# Patient Record
Sex: Male | Born: 1955
Health system: Southern US, Community
[De-identification: ages and names within clinical notes are randomized; demographics above are authoritative.]

## PROBLEM LIST (undated history)

## (undated) DIAGNOSIS — F419 Anxiety disorder, unspecified: Secondary | ICD-10-CM

## (undated) DIAGNOSIS — H409 Unspecified glaucoma: Secondary | ICD-10-CM

## (undated) HISTORY — DX: Anxiety disorder, unspecified: F41.9

## (undated) HISTORY — PX: SPINE SURGERY: SHX786

## (undated) HISTORY — DX: Unspecified glaucoma: H40.9

---

## 2002-06-20 ENCOUNTER — Encounter: Admission: RE | Admit: 2002-06-20 | Discharge: 2002-06-20 | Payer: Self-pay | Admitting: Family Medicine

## 2002-06-20 ENCOUNTER — Encounter: Payer: Self-pay | Admitting: Family Medicine

## 2002-07-05 ENCOUNTER — Ambulatory Visit (HOSPITAL_COMMUNITY): Admission: RE | Admit: 2002-07-05 | Discharge: 2002-07-05 | Payer: Self-pay | Admitting: Neurosurgery

## 2002-07-05 ENCOUNTER — Encounter: Payer: Self-pay | Admitting: Neurosurgery

## 2008-11-13 LAB — HM COLONOSCOPY

## 2008-12-28 ENCOUNTER — Ambulatory Visit: Payer: Self-pay | Admitting: Internal Medicine

## 2009-01-11 ENCOUNTER — Ambulatory Visit: Payer: Self-pay | Admitting: Internal Medicine

## 2011-01-31 ENCOUNTER — Encounter: Payer: Self-pay | Admitting: Physician Assistant

## 2011-02-14 NOTE — Op Note (Signed)
   NAME:  Jay Larsen, Jay Larsen                      ACCOUNT NO.:  [id number]   MEDICAL RECORD NO.:  [id number]                   PATIENT TYPE:  OIB   LOCATION:  NA                                   FACILITY:  [facility]   PHYSICIAN:  Tanya Nones. Jeral Fruit, M.D.             DATE OF BIRTH:  [birth year]   DATE OF PROCEDURE:  07/05/2002  DATE OF DISCHARGE:                                 OPERATIVE REPORT   PREOPERATIVE DIAGNOSIS:  Left L5-S1 herniated disk with a free fragment.   POSTOPERATIVE DIAGNOSIS:  Left L5-S1 herniated disk with a free fragment.   PROCEDURE:  Left L5-S1 diskectomy with removal of four to five large free  fragments, total diskectomy, foraminotomy.  Microscope.   SURGEON:  Tanya Nones. Jeral Fruit, M.D.   ASSISTANT:  Stefani Dama, M.D.   CLINICAL HISTORY:  The patient is a 55 year old gentleman.  I met him  because and left leg pain for more than five weeks.  He has 2/5 weakness on  dorsiflexion.  MRI showed a large herniated disk with a fragment going to  the foramen.  Surgery was advised.   DESCRIPTION OF PROCEDURE:  The patient was taken to the OR, and he was  positioned in a prone manner.  The back was prepped with Betadine.  A  midline incision from L5 to S1 was made, and muscle was retracted laterally.  X-ray showed that indeed we were at the level of L5-S1.  There with the help  of the microscope we started drilling in the lower lamina of L5, the upper  of S1.  The yellow ligament was also excised.  Indeed, we found that the S1  nerve root was swollen and reddish.  Retraction was done and right at the  takeoff of S1, there were at least four to five fragments, two of them at  the level of the axilla.  Removal was done.  Then we entered the disk space,  where there was an opening in the annulus.  Total gross diskectomy medial  and lateral was achieved.  Then we investigated the foramen of L5 as well as  S1, it was negative.  Valsalva maneuver was negative.  Then the area  was  irrigated.  Fentanyl and Depo-Medrol were left in the dural space, and the  wound was closed with Vicryl and Steri-Strip.                                               Tanya Nones. Jeral Fruit, M.D.    EMB/MEDQ  D:  07/05/2002  T:  07/06/2002  Job:  [id number]

## 2011-02-14 NOTE — H&P (Signed)
NAME:  Jay Larsen, Jay Larsen                      ACCOUNT NO.:  [id number]   MEDICAL RECORD NO.:  [id number]                   PATIENT TYPE:  OIB   LOCATION:  3030                                 FACILITY:  [facility]   PHYSICIAN:  Hilda Lias, MD                  DATE OF BIRTH:  [birth year]   DATE OF ADMISSION:  07/05/2002  DATE OF DISCHARGE:  07/05/2002                                HISTORY & PHYSICAL   HISTORY OF PRESENT ILLNESS:  The patient is a gentleman who about four weeks  ago developed the sudden onset of back pain.  It went posterolaterally to  the hip and then to the left foot associated with weakness.  The patient had  been unable to walk.  He was quite miserable.  The patient had conservative  treatment without any improvement.  He denies any problem with the right  leg.  An MRI was obtained and sent for further evaluation.   PAST MEDICAL HISTORY:  Negative.   SOCIAL HISTORY:  The patient does not smoke.  He drinks socially.   FAMILY HISTORY:  His mother is 27 with diabetes and high blood pressure.  His father is 15 with high blood pressure.   REVIEW OF SYMPTOMS:  Positive for back and left leg pain.   PHYSICAL EXAMINATION:  HEIGHT:  6 feet 4 inches.  WEIGHT:  255 pounds.  GENERAL APPEARANCE:  The patient came to my office limping from the left  leg.  He had difficulty sitting.  HEENT:  Normal.  NECK:  Normal.  LUNGS:  Clear.  HEART:  Heart sounds normal.  ABDOMEN:  Normal.  EXTREMITIES:  Normal pulses.  NEUROLOGIC:  Mental status normal.  Cranial nerves normal.  Strength is 5/5,  except for 2/5 weakness in plantar flexion and 4/5 weakness in dorsiflexion.  Reflexes symmetrical with absent left ankle jerk.  Straight leg raising on  the left side is positive at 30 degrees.   LABORATORY DATA:  The MRI shows that he has some degenerative disk at the  level of L4-5 and L5-S1, but at the level of L5-S1 he has a large herniated  disk with a flattening compromising the  S1 nerve root.   CLINICAL IMPRESSION:  1. Left L5-S1 herniated disk with weakness of the left foot.  2. Degenerative disk disease, lumbar.    RECOMMENDATIONS:  The patient wants to proceed with surgery.  The surgery  was fully explained to him.  The risks, such as infection, CSF leak,  worsening pain, need for further surgery, no improvement whatsoever, and  damage to the __________ were explained.  The patient declined another  opinion.                                               Lynne Logan  Jeral Fruit, MD    EB/MEDQ  D:  07/05/2002  T:  07/07/2002  Job:  [id number]

## 2013-03-28 ENCOUNTER — Ambulatory Visit (INDEPENDENT_AMBULATORY_CARE_PROVIDER_SITE_OTHER): Payer: BC Managed Care – PPO | Admitting: Nurse Practitioner

## 2013-03-28 ENCOUNTER — Encounter: Payer: Self-pay | Admitting: Nurse Practitioner

## 2013-03-28 VITALS — BP 137/89 | HR 91 | Temp 97.5°F | Ht 76.0 in | Wt 190.0 lb

## 2013-03-28 DIAGNOSIS — Z0000 Encounter for general adult medical examination without abnormal findings: Secondary | ICD-10-CM

## 2013-03-28 DIAGNOSIS — F411 Generalized anxiety disorder: Secondary | ICD-10-CM

## 2013-03-28 DIAGNOSIS — H409 Unspecified glaucoma: Secondary | ICD-10-CM

## 2013-03-28 LAB — POCT URINALYSIS DIPSTICK
Bilirubin, UA: NEGATIVE
Glucose, UA: NEGATIVE
Ketones, UA: NEGATIVE
Leukocytes, UA: NEGATIVE
Nitrite, UA: NEGATIVE
Protein, UA: NEGATIVE
Spec Grav, UA: 1.015
Urobilinogen, UA: NEGATIVE
pH, UA: 6.5

## 2013-03-28 LAB — POCT UA - MICROSCOPIC ONLY
Bacteria, U Microscopic: NEGATIVE
Casts, Ur, LPF, POC: NEGATIVE
Mucus, UA: NEGATIVE
WBC, Ur, HPF, POC: NEGATIVE
Yeast, UA: NEGATIVE

## 2013-03-28 MED ORDER — LORAZEPAM 0.5 MG PO TABS: 0.5000 mg | ORAL_TABLET | Freq: Two times a day (BID) | ORAL | Status: DC | PRN

## 2013-03-28 NOTE — Progress Notes (Signed)
  Subjective:    Patient ID: Jay Larsen, male    DOB: Dec 10, 1955, 57 y.o.   MRN: [id number]  HPI Patient here today fr CPE- He is doing quite well an dhas no complaints today- Only current medical problem is glaucoma left eye that he uses eye drops for.    Review of Systems  Constitutional: Negative.   HENT: Negative.   Eyes: Negative.   Respiratory: Negative.   Cardiovascular: Negative.   Gastrointestinal: Negative.   Endocrine: Negative.   Genitourinary: Negative.   Musculoskeletal: Negative.   Allergic/Immunologic: Negative.   Neurological: Negative.   Hematological: Negative.   Psychiatric/Behavioral: Negative.        Objective:   Physical Exam  Constitutional: He is oriented to person, place, and time. He appears well-developed and well-nourished.  HENT:  Head: Normocephalic.  Right Ear: External ear normal.  Left Ear: External ear normal.  Nose: Nose normal.  Mouth/Throat: Oropharynx is clear and moist.  Eyes: EOM are normal. Pupils are equal, round, and reactive to light.  Neck: Normal range of motion. Neck supple. No thyromegaly present.  Cardiovascular: Normal rate, regular rhythm, normal heart sounds and intact distal pulses.   No murmur heard. Pulmonary/Chest: Effort normal and breath sounds normal. He has no wheezes. He has no rales.  Abdominal: Soft. Bowel sounds are normal.  Genitourinary: Prostate normal and penis normal. Guaiac negative stool.  Musculoskeletal: Normal range of motion.  Neurological: He is alert and oriented to person, place, and time.  Skin: Skin is warm and dry.  Psychiatric: He has a normal mood and affect. His behavior is normal. Judgment and thought content normal.    BP 137/89  Pulse 91  Temp(Src) 97.5 F (36.4 C) (Oral)  Ht 6\' 4"  (1.93 m)  Wt 190 lb (86.183 kg)  BMI 23.14 kg/m2       Assessment & Plan:   1. Annual physical exam   2. GAD (generalized anxiety disorder)   3. Glaucoma    Orders Placed This  Encounter  Procedures  . POCT urinalysis dipstick  . POCT UA - Microscopic Only   Outpatient Encounter Prescriptions as of 03/28/2013  Medication Sig Dispense Refill  . brimonidine (ALPHAGAN P) 0.1 % SOLN Place 2 drops into the left eye daily.      Marland Kitchen LORazepam (ATIVAN) 0.5 MG tablet Take 1 tablet (0.5 mg total) by mouth every 12 (twelve) hours as needed for anxiety.  30 tablet  0  . Travoprost, BAK Free, (TRAVATAN) 0.004 % SOLN ophthalmic solution Place 1 drop into the left eye at bedtime.      . [DISCONTINUED] LORazepam (ATIVAN) 0.5 MG tablet Take 0.5 mg by mouth every 12 (twelve) hours as needed for anxiety.       No facility-administered encounter medications on file as of 03/28/2013.   Continue current meds Diet and exercise encouraged Labs done at work and reviewed at appointment Follow up in 6 months to recheck LDL  Mary-Margaret Daphine Deutscher, FNP

## 2013-03-28 NOTE — Patient Instructions (Signed)
Health Maintenance, Males A healthy lifestyle and preventative care can promote health and wellness.  Maintain regular health, dental, and eye exams.  Eat a healthy diet. Foods like vegetables, fruits, whole grains, low-fat dairy products, and lean protein foods contain the nutrients you need without too many calories. Decrease your intake of foods high in solid fats, added sugars, and salt. Get information about a proper diet from your caregiver, if necessary.  Regular physical exercise is one of the most important things you can do for your health. Most adults should get at least 150 minutes of moderate-intensity exercise (any activity that increases your heart rate and causes you to sweat) each week. In addition, most adults need muscle-strengthening exercises on 2 or more days a week.   Maintain a healthy weight. The body mass index (BMI) is a screening tool to identify possible weight problems. It provides an estimate of body fat based on height and weight. Your caregiver can help determine your BMI, and can help you achieve or maintain a healthy weight. For adults 20 years and older:  A BMI below 18.5 is considered underweight.  A BMI of 18.5 to 24.9 is normal.  A BMI of 25 to 29.9 is considered overweight.  A BMI of 30 and above is considered obese.  Maintain normal blood lipids and cholesterol by exercising and minimizing your intake of saturated fat. Eat a balanced diet with plenty of fruits and vegetables. Blood tests for lipids and cholesterol should begin at age 20 and be repeated every 5 years. If your lipid or cholesterol levels are high, you are over 50, or you are a high risk for heart disease, you may need your cholesterol levels checked more frequently.Ongoing high lipid and cholesterol levels should be treated with medicines, if diet and exercise are not effective.  If you smoke, find out from your caregiver how to quit. If you do not use tobacco, do not start.  If you  choose to drink alcohol, do not exceed 2 drinks per day. One drink is considered to be 12 ounces (355 mL) of beer, 5 ounces (148 mL) of wine, or 1.5 ounces (44 mL) of liquor.  Avoid use of street drugs. Do not share needles with anyone. Ask for help if you need support or instructions about stopping the use of drugs.  High blood pressure causes heart disease and increases the risk of stroke. Blood pressure should be checked at least every 1 to 2 years. Ongoing high blood pressure should be treated with medicines if weight loss and exercise are not effective.  If you are 45 to 55 years old, ask your caregiver if you should take aspirin to prevent heart disease.  Diabetes screening involves taking a blood sample to check your fasting blood sugar level. This should be done once every 3 years, after age 45, if you are within normal weight and without risk factors for diabetes. Testing should be considered at a younger age or be carried out more frequently if you are overweight and have at least 1 risk factor for diabetes.  Colorectal cancer can be detected and often prevented. Most routine colorectal cancer screening begins at the age of 50 and continues through age 75. However, your caregiver may recommend screening at an earlier age if you have risk factors for colon cancer. On a yearly basis, your caregiver may provide home test kits to check for hidden blood in the stool. Use of a small camera at the end of a tube,   to directly examine the colon (sigmoidoscopy or colonoscopy), can detect the earliest forms of colorectal cancer. Talk to your caregiver about this at age 50, when routine screening begins. Direct examination of the colon should be repeated every 5 to 10 years through age 75, unless early forms of pre-cancerous polyps or small growths are found.  Hepatitis C blood testing is recommended for all people born from 1945 through 1965 and any individual with known risks for hepatitis C.  Healthy  men should no longer receive prostate-specific antigen (PSA) blood tests as part of routine cancer screening. Consult with your caregiver about prostate cancer screening.  Testicular cancer screening is not recommended for adolescents or adult males who have no symptoms. Screening includes self-exam, caregiver exam, and other screening tests. Consult with your caregiver about any symptoms you have or any concerns you have about testicular cancer.  Practice safe sex. Use condoms and avoid high-risk sexual practices to reduce the spread of sexually transmitted infections (STIs).  Use sunscreen with a sun protection factor (SPF) of 30 or greater. Apply sunscreen liberally and repeatedly throughout the day. You should seek shade when your shadow is shorter than you. Protect yourself by wearing long sleeves, pants, a wide-brimmed hat, and sunglasses year round, whenever you are outdoors.  Notify your caregiver of new moles or changes in moles, especially if there is a change in shape or color. Also notify your caregiver if a mole is larger than the size of a pencil eraser.  A one-time screening for abdominal aortic aneurysm (AAA) and surgical repair of large AAAs by sound wave imaging (ultrasonography) is recommended for ages 65 to 75 years who are current or former smokers.  Stay current with your immunizations. Document Released: 03/13/2008 Document Revised: 12/08/2011 Document Reviewed: 02/10/2011 [employer] Patient Information 2014 [employer].  

## 2014-03-29 ENCOUNTER — Encounter: Payer: Self-pay | Admitting: Nurse Practitioner

## 2014-03-29 ENCOUNTER — Ambulatory Visit (INDEPENDENT_AMBULATORY_CARE_PROVIDER_SITE_OTHER): Payer: BC Managed Care – PPO

## 2014-03-29 ENCOUNTER — Ambulatory Visit (INDEPENDENT_AMBULATORY_CARE_PROVIDER_SITE_OTHER): Payer: BC Managed Care – PPO | Admitting: Nurse Practitioner

## 2014-03-29 VITALS — BP 126/74 | HR 80 | Temp 98.1°F | Ht 76.0 in | Wt 183.0 lb

## 2014-03-29 DIAGNOSIS — Z0000 Encounter for general adult medical examination without abnormal findings: Secondary | ICD-10-CM

## 2014-03-29 DIAGNOSIS — F411 Generalized anxiety disorder: Secondary | ICD-10-CM

## 2014-03-29 MED ORDER — LORAZEPAM 0.5 MG PO TABS: 0.5000 mg | ORAL_TABLET | Freq: Two times a day (BID) | ORAL | Status: DC | PRN

## 2014-03-29 NOTE — Addendum Note (Signed)
Addended by: Bennie PieriniMARTIN, MARY-MARGARET on: 03/29/2014 09:43 AM   Modules accepted: Orders

## 2014-03-29 NOTE — Patient Instructions (Signed)

## 2014-03-29 NOTE — Progress Notes (Addendum)
   Subjective:    Patient ID: Jay Larsen, male    DOB: 11/08/1955, 58 y.o.   MRN: [id number]  HPI Patient here today for annual physical exam. Doing well without complaints. Brought labs that were done at work to be reviewed. His only medical problem is GAD and he is taking ativan on prn basis.     Review of Systems  Constitutional: Negative.   HENT: Negative.   Respiratory: Negative.   Cardiovascular: Negative.   Genitourinary: Negative.   Neurological: Negative.   Psychiatric/Behavioral: Negative.   All other systems reviewed and are negative.      Objective:   Physical Exam  Constitutional: He is oriented to person, place, and time. He appears well-developed and well-nourished.  HENT:  Head: Normocephalic.  Right Ear: External ear normal.  Left Ear: External ear normal.  Nose: Nose normal.  Mouth/Throat: Oropharynx is clear and moist.  Eyes: Conjunctivae are normal. Pupils are equal, round, and reactive to light.  Left eye drifts laterally  Neck: Normal range of motion. Neck supple. No JVD present. No thyromegaly present.  Cardiovascular: Normal rate, regular rhythm, normal heart sounds and intact distal pulses.  Exam reveals no gallop and no friction rub.   No murmur heard. Pulmonary/Chest: Effort normal and breath sounds normal. No respiratory distress. He has no wheezes. He has no rales. He exhibits no tenderness.  Abdominal: Soft. Bowel sounds are normal. He exhibits no mass. There is no tenderness.  Genitourinary: Prostate normal and penis normal. Guaiac negative stool.  Musculoskeletal: Normal range of motion. He exhibits no edema.  Lymphadenopathy:    He has no cervical adenopathy.  Neurological: He is alert and oriented to person, place, and time. No cranial nerve deficit.  Skin: Skin is warm and dry.  Psychiatric: He has a normal mood and affect. His behavior is normal. Judgment and thought content normal.    BP 126/74  Pulse 80  Temp(Src) 98.1 F  (36.7 C) (Oral)  Ht 6\' 4"  (1.93 m)  Wt 183 lb (83.008 kg)  BMI 22.28 kg/m2  SpO2 98% EKG- NSR-Mary-Margaret Daphine DeutscherMartin, FNP Chest xray - normal- no acute findings- Preliminary reading by Paulene FloorMary Brookelin Felber, FNP  [facility]       Assessment & Plan:   1. GAD (generalized anxiety disorder)   2. Annual physical exam      Meds ordered this encounter  Medications  . LORazepam (ATIVAN) 0.5 MG tablet    Sig: Take 1 tablet (0.5 mg total) by mouth every 12 (twelve) hours as needed for anxiety.    Dispense:  30 tablet    Refill:  1    Order Specific Question:  Supervising Provider    Answer:  Ernestina PennaMOORE, DONALD W [[id number]]    Labs reviewed at appointment with patient Health maintenance reviewed Diet and exercise encouraged Continue all meds Follow up  In 6 months and prn    Mary-Margaret Daphine DeutscherMartin, FNP

## 2015-01-02 ENCOUNTER — Ambulatory Visit (HOSPITAL_COMMUNITY)
Admission: RE | Admit: 2015-01-02 | Discharge: 2015-01-02 | Disposition: A | Discharge status: Home / Self Care | Payer: BLUE CROSS/BLUE SHIELD | Source: Ambulatory Visit | Attending: Family Medicine | Admitting: Family Medicine

## 2015-01-02 ENCOUNTER — Ambulatory Visit (INDEPENDENT_AMBULATORY_CARE_PROVIDER_SITE_OTHER): Payer: BLUE CROSS/BLUE SHIELD | Admitting: Family Medicine

## 2015-01-02 ENCOUNTER — Encounter: Payer: Self-pay | Admitting: Family Medicine

## 2015-01-02 VITALS — BP 144/79 | HR 105 | Temp 97.7°F | Ht 76.0 in | Wt 183.0 lb

## 2015-01-02 DIAGNOSIS — R110 Nausea: Secondary | ICD-10-CM | POA: Insufficient documentation

## 2015-01-02 DIAGNOSIS — R1011 Right upper quadrant pain: Secondary | ICD-10-CM

## 2015-01-02 MED ORDER — ONDANSETRON HCL 4 MG PO TABS: 4.0000 mg | ORAL_TABLET | Freq: Three times a day (TID) | ORAL | Status: DC | PRN

## 2015-01-02 NOTE — Progress Notes (Signed)
   Subjective:    Patient ID: Jay Larsen, male    DOB: 10/09/1955, 59 y.o.   MRN: [id number]  HPI 59 year old gentleman with two-week history of nausea. He vomited this morning. He has had some right upper quadrant pain. Food seems to aggravate symptoms. There are no food intolerances prior to the onset of symptoms. He has not had any prior abdominal surgery. He's had minimal diarrhea. There is been no known exposure to community gastroenteritis.  Patient Active Problem List   Diagnosis Date Noted  . GAD (generalized anxiety disorder) 03/28/2013  . Glaucoma 03/28/2013   Outpatient Encounter Prescriptions as of 01/02/2015  Medication Sig  . brimonidine (ALPHAGAN P) 0.1 % SOLN Place 2 drops into the left eye daily.  Marland Kitchen. LORazepam (ATIVAN) 0.5 MG tablet Take 1 tablet (0.5 mg total) by mouth every 12 (twelve) hours as needed for anxiety.  . Travoprost, BAK Free, (TRAVATAN) 0.004 % SOLN ophthalmic solution Place 1 drop into the left eye at bedtime.       Review of Systems  Constitutional: Negative.   HENT: Negative.   Eyes: Negative.   Respiratory: Negative.  Negative for shortness of breath.   Cardiovascular: Negative.  Negative for chest pain and leg swelling.  Gastrointestinal: Positive for nausea, vomiting and abdominal pain.  Genitourinary: Negative.   Musculoskeletal: Negative.   Skin: Negative.   Neurological: Negative.   Psychiatric/Behavioral: Negative.   All other systems reviewed and are negative.      Objective:   Physical Exam  Constitutional: He appears well-developed and well-nourished.  Cardiovascular: Normal rate and regular rhythm.   Pulmonary/Chest: Effort normal and breath sounds normal.  Abdominal: Soft. Bowel sounds are normal. There is no tenderness. There is no rebound and no guarding.    BP 144/79 mmHg  Pulse 105  Temp(Src) 97.7 F (36.5 C) (Oral)  Ht 6\' 4"  (1.93 m)  Wt 183 lb (83.008 kg)  BMI 22.28 kg/m2       Assessment & Plan:  1. RUQ  pain Consider gallbladder disease versus enteritis plan treat symptomatically with Zofran. Keep diet on the light side are clear liquids and schedule ultrasound - US Abdomen Complete; Future  Frederica KusterStephen M Miller MD

## 2015-01-04 ENCOUNTER — Telehealth: Payer: Self-pay | Admitting: *Deleted

## 2015-01-04 NOTE — Telephone Encounter (Signed)
-----   Message from Frederica KusterStephen M Miller, MD sent at 01/03/2015 12:47 PM EDT ----- Ultrasound results show no evidence of gallbladder disease so what I would expect his symptoms to be consistent with a viral enteritis

## 2015-01-04 NOTE — Telephone Encounter (Signed)
lmtcb regarding ultrasound report 

## 2015-02-04 HISTORY — PX: EYE SURGERY: SHX253

## 2015-04-06 ENCOUNTER — Encounter: Payer: Self-pay | Admitting: Internal Medicine

## 2015-05-10 ENCOUNTER — Ambulatory Visit (INDEPENDENT_AMBULATORY_CARE_PROVIDER_SITE_OTHER): Payer: BLUE CROSS/BLUE SHIELD | Admitting: Family Medicine

## 2015-05-10 ENCOUNTER — Encounter (INDEPENDENT_AMBULATORY_CARE_PROVIDER_SITE_OTHER): Payer: Self-pay

## 2015-05-10 ENCOUNTER — Encounter: Payer: Self-pay | Admitting: Family Medicine

## 2015-05-10 VITALS — BP 137/75 | HR 83 | Temp 97.4°F | Ht 76.0 in | Wt 183.6 lb

## 2015-05-10 DIAGNOSIS — Z0000 Encounter for general adult medical examination without abnormal findings: Secondary | ICD-10-CM | POA: Diagnosis not present

## 2015-05-10 DIAGNOSIS — F411 Generalized anxiety disorder: Secondary | ICD-10-CM

## 2015-05-10 MED ORDER — LORAZEPAM 0.5 MG PO TABS: 0.5000 mg | ORAL_TABLET | Freq: Two times a day (BID) | ORAL | Status: DC | PRN

## 2015-05-10 NOTE — Assessment & Plan Note (Signed)
In general very good health, discussed exercise and diet recommendations Congratulated him on good habits that he's developed, reviewed labs that are drawn in his company Fasting blood sugar 97, LDL 120, HDL 44, PSA 0.2

## 2015-05-10 NOTE — Assessment & Plan Note (Signed)
No persistent symptoms, uses approximately 6-10 Xanax per year Refilled Xanax, one refill Discussed daily medications with him, however I don't think this is necessary with him only using 1-2 pills every 2-3 months

## 2015-05-10 NOTE — Progress Notes (Signed)
Patient ID: Jay Larsen, male   DOB: [birth year], 59 y.o.   MRN: [id number]   HPI  Patient presents today for annual physical exam  Patient explains that he is in good health and has no symptoms to complain about today.  He has an anxiety disorder and uses about 6-10 Xanax per year. He needs a refill today. He states last use was about 2 months ago when his wife lost her job and they were in a very stressful time.  He denies chest pain, dyspnea, palpitations, leg edema, abdominal pain, bowel or bladder dysfunction, and problems with appetite  He walks about 3 miles 3-5 times per week In general he watches his diet and eats mostly healthy foods with small amount of junk food.  PMH: Smoking status noted Past medical, surgical, family, and social history were reviewed and updated in relevant portions of EMR ROS: Per HPI, otherwise negative  Objective: BP 137/75 mmHg  Pulse 83  Temp(Src) 97.4 F (36.3 C) (Oral)  Ht  (1.93 m)  Wt 183 lb 9.6 oz (83.28 kg)  BMI 22.36 kg/m2 Gen: NAD, alert, cooperative with exam HEENT: NCAT, nares clear bilaterally, oropharynx clear, left TM with some heme crusted cerumen, right TM WNL, strabismus Neck: Supple, no tender lymphadenopathy CV: RRR, good S1/S2, no murmur Resp: CTABL, no wheezes, non-labored Abd: SNTND, BS present, no guarding or organomegaly Ext: No edema, warm Neuro: Alert and oriented, No gross deficits, 2+ patellar reflexes Skin: No rash  Assessment and plan:  GAD (generalized anxiety disorder) No persistent symptoms, uses approximately 6-10 Xanax per year Refilled Xanax, one refill Discussed daily medications with him, however I don't think this is necessary with him only using 1-2 pills every 2-3 months  Annual physical exam In general very good health, discussed exercise and diet recommendations Congratulated him on good habits that he's developed, reviewed labs that are drawn in his company Fasting blood sugar 97,  LDL 120, HDL 44, PSA 0.2     Meds ordered this encounter  Medications  . LORazepam (ATIVAN) 0.5 MG tablet    Sig: Take 1 tablet (0.5 mg total) by mouth every 12 (twelve) hours as needed for anxiety.    Dispense:  30 tablet    Refill:  1    Murtis Sink, MD [place] [facility] 05/10/2015, 10:11 AM

## 2015-05-10 NOTE — Patient Instructions (Signed)
Great to meet you!  You are doing great work watching your diet and exercises, Keep it up!  Come back as needed, otherwise we will see you in a year.   Please send me your labs when you get them, I would be glad to review them with you if you'd like.

## 2016-01-22 DIAGNOSIS — H4032X3 Glaucoma secondary to eye trauma, left eye, severe stage: Secondary | ICD-10-CM | POA: Diagnosis not present

## 2016-01-23 DIAGNOSIS — R7301 Impaired fasting glucose: Secondary | ICD-10-CM | POA: Diagnosis not present

## 2016-01-23 DIAGNOSIS — F419 Anxiety disorder, unspecified: Secondary | ICD-10-CM | POA: Diagnosis not present

## 2016-01-23 DIAGNOSIS — Z008 Encounter for other general examination: Secondary | ICD-10-CM | POA: Diagnosis not present

## 2016-01-23 DIAGNOSIS — E782 Mixed hyperlipidemia: Secondary | ICD-10-CM | POA: Diagnosis not present

## 2016-02-04 DIAGNOSIS — H43312 Vitreous membranes and strands, left eye: Secondary | ICD-10-CM | POA: Diagnosis not present

## 2016-02-04 DIAGNOSIS — H4303 Vitreous prolapse, bilateral: Secondary | ICD-10-CM | POA: Diagnosis not present

## 2016-02-04 DIAGNOSIS — H43392 Other vitreous opacities, left eye: Secondary | ICD-10-CM | POA: Diagnosis not present

## 2016-02-04 DIAGNOSIS — H4032X3 Glaucoma secondary to eye trauma, left eye, severe stage: Secondary | ICD-10-CM | POA: Diagnosis not present

## 2016-03-01 DIAGNOSIS — W57XXXA Bitten or stung by nonvenomous insect and other nonvenomous arthropods, initial encounter: Secondary | ICD-10-CM | POA: Diagnosis not present

## 2016-03-01 DIAGNOSIS — R21 Rash and other nonspecific skin eruption: Secondary | ICD-10-CM | POA: Diagnosis not present

## 2016-05-13 IMAGING — US US ABDOMEN COMPLETE
1 series · 14 of 25 positions shown · non-contrast
Comparison: None.

CLINICAL DATA: Right upper quadrant abdominal pain with nausea for
2 weeks.

EXAM:
ULTRASOUND ABDOMEN COMPLETE

[Series 1: us abdomen complete · 0.15mm/px · 14 of 78 slices shown]
[im 1/78]
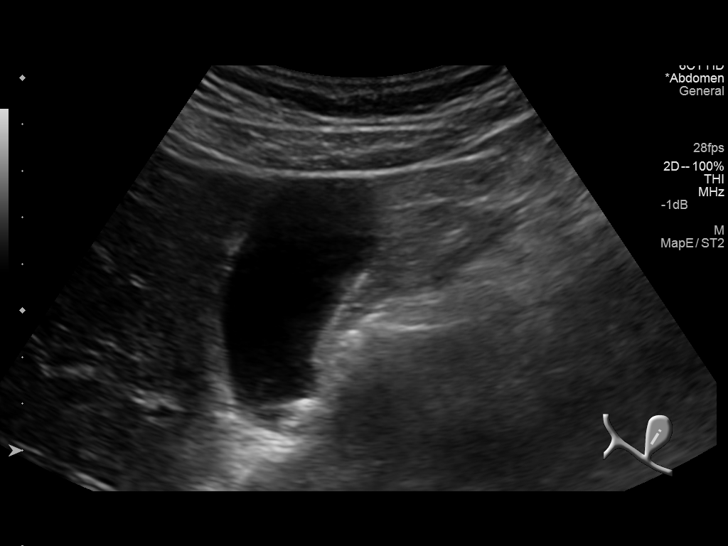
[im 7/78]
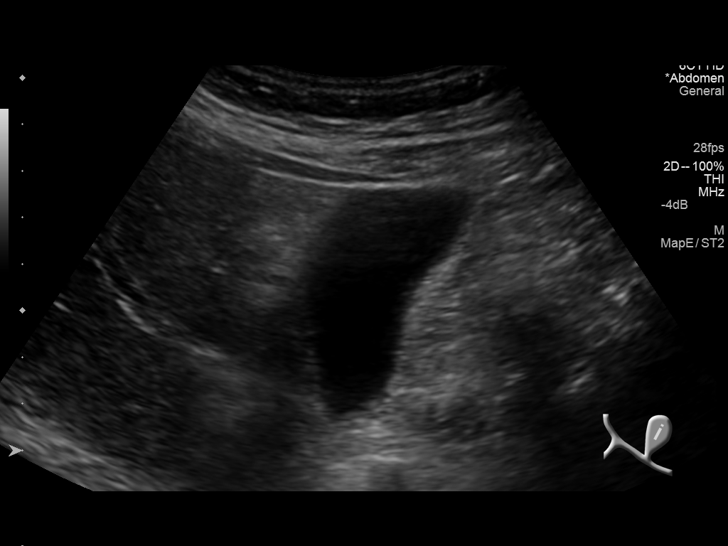
[im 13/78]
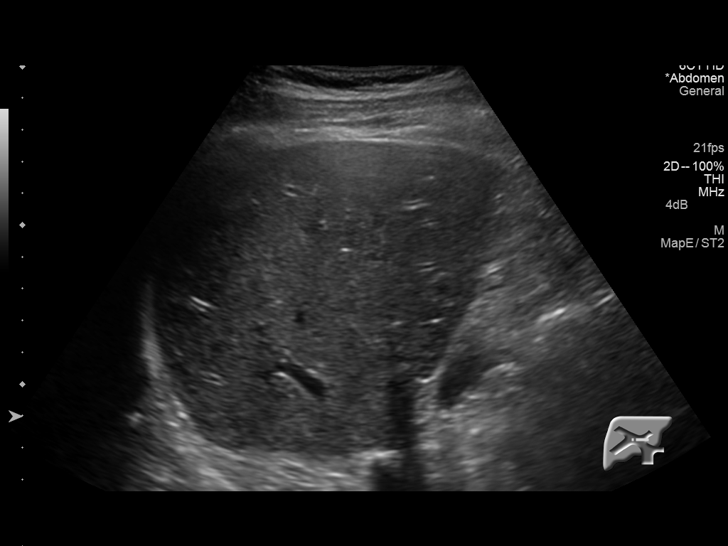
[im 20/78]
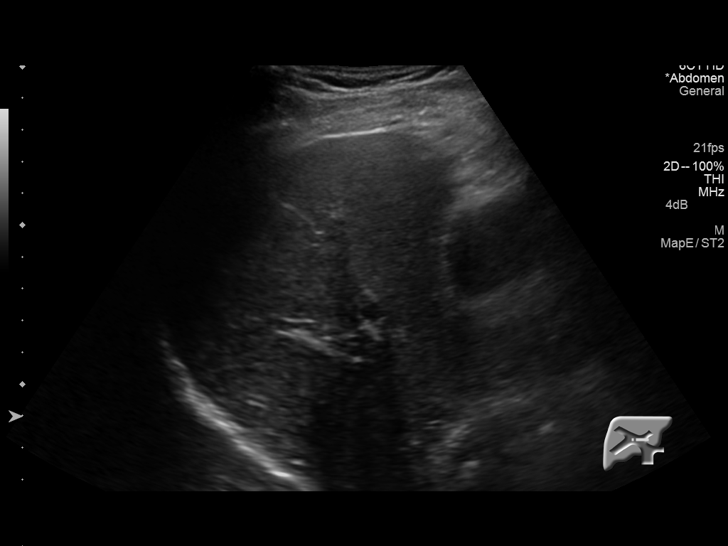
[im 26/78]
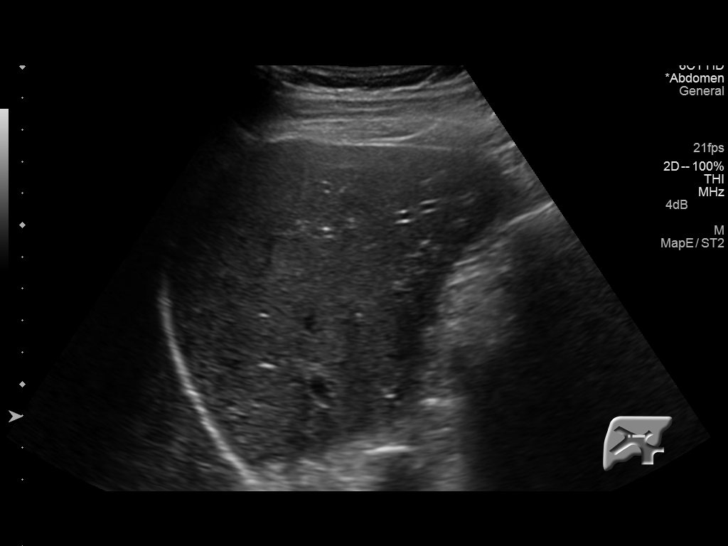
[im 29/78]
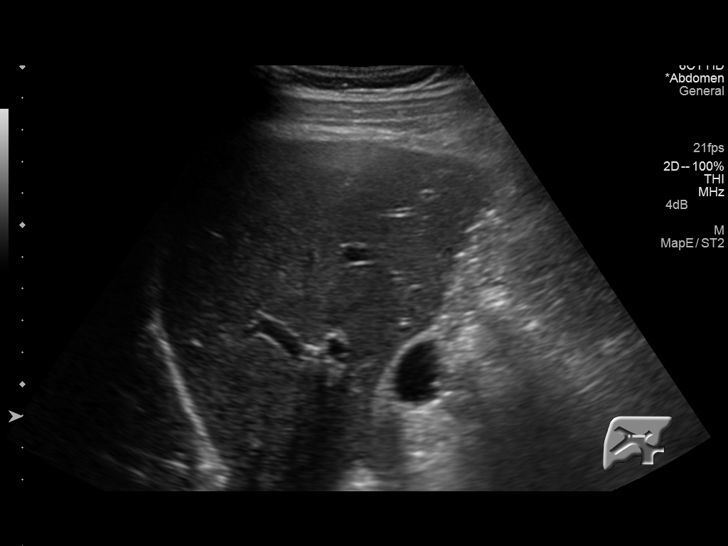
[im 36/78]
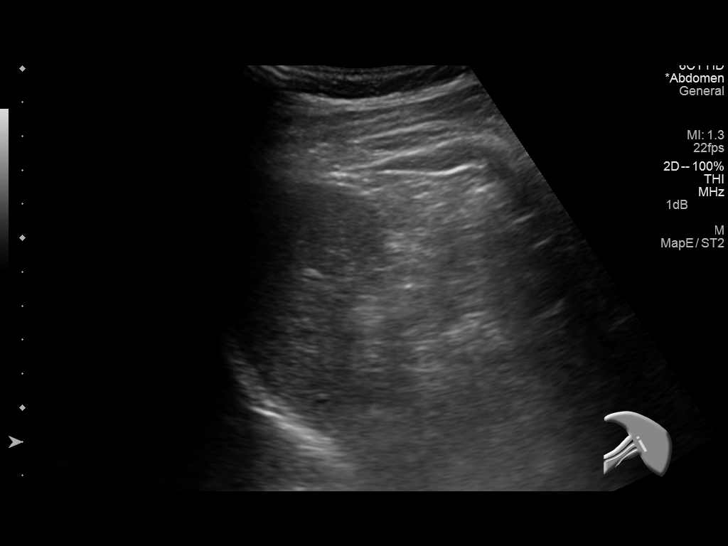
[im 42/78]
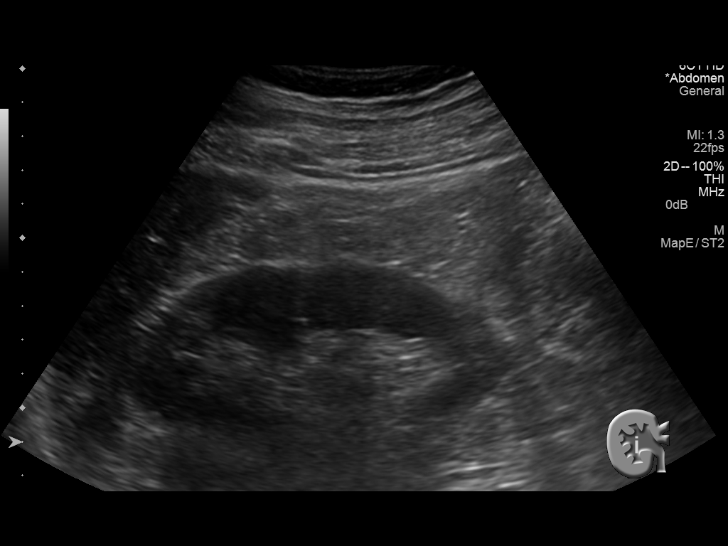
[im 49/78]
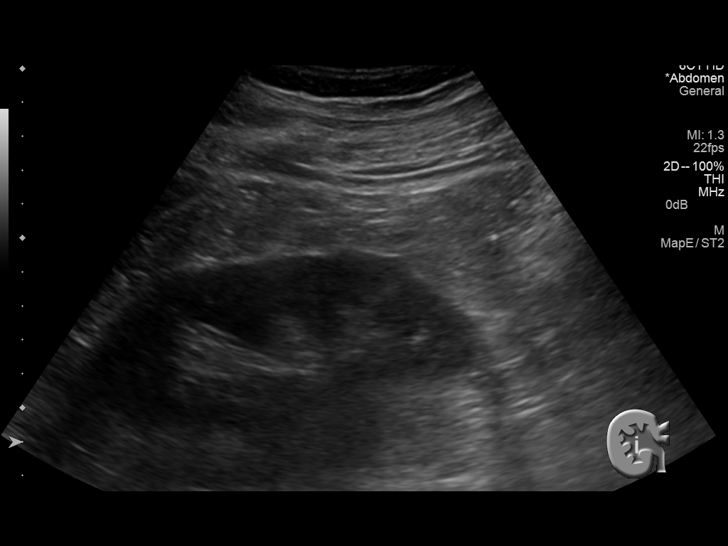
[im 52/78]
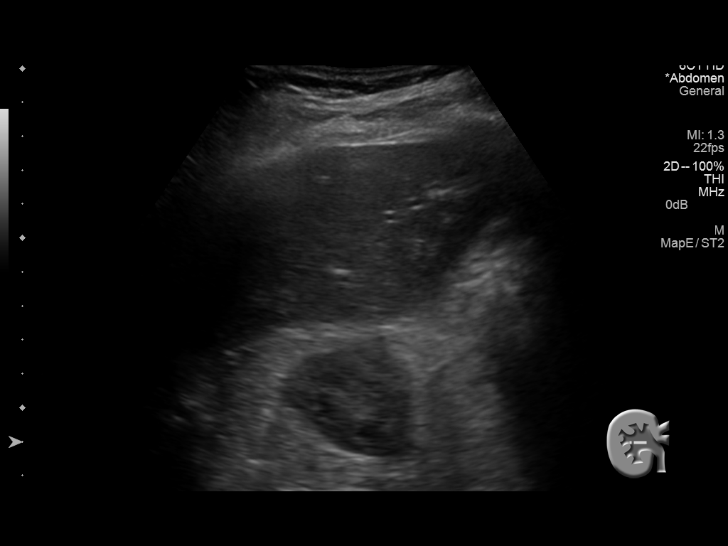
[im 58/78]
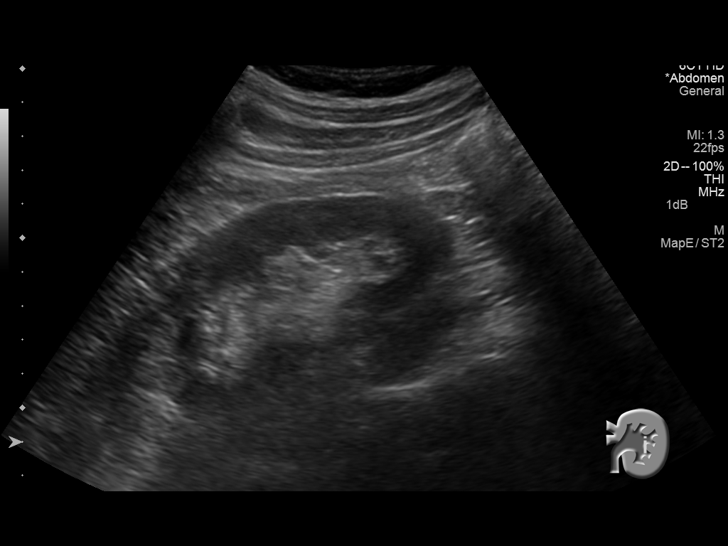
[im 65/78]
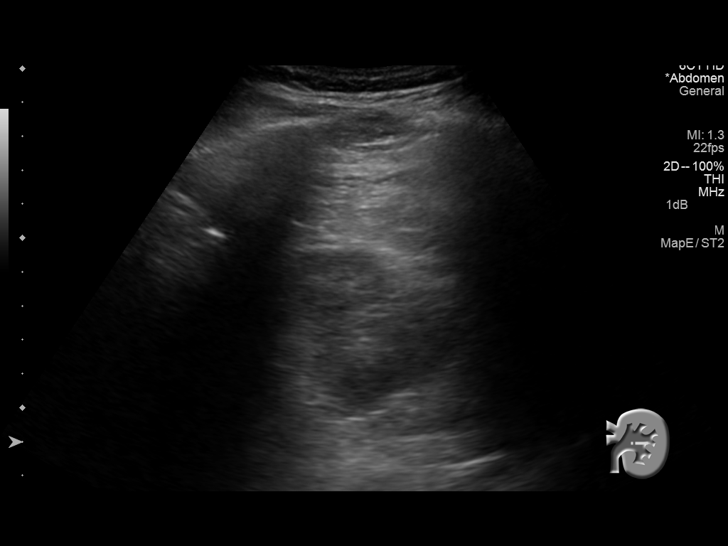
[im 71/78]
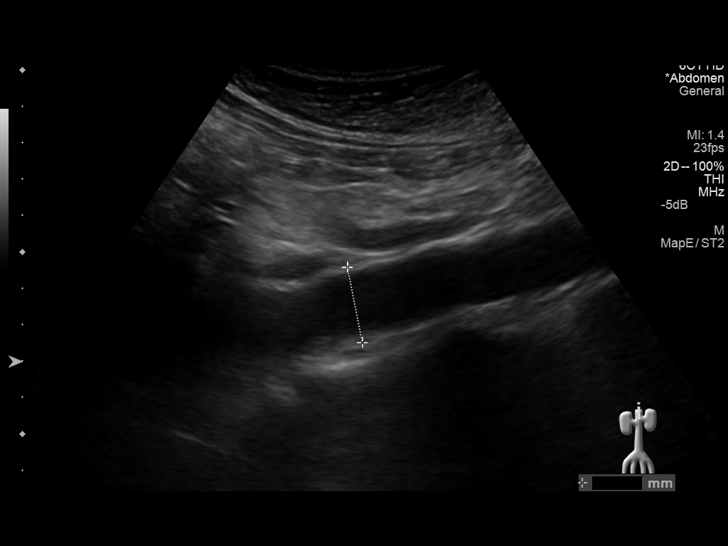
[im 78/78]
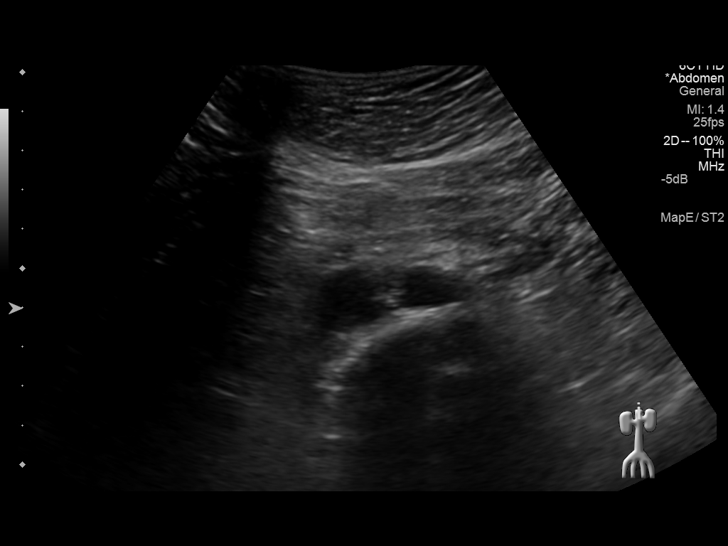

[14 of 25 positions shown; findings below may reference images not displayed]

FINDINGS: Gallbladder: No gallstones or wall thickening visualized. No
sonographic Murphy sign noted.

Common bile duct: Diameter: 3.6 mm which is within normal limits.

Liver: No focal lesion identified. Within normal limits in
parenchymal echogenicity.

IVC: Not visualized.

Pancreas: Visualized portion unremarkable.

Spleen: Size and appearance within normal limits.

Right Kidney: Length: 11.6 cm. Echogenicity within normal limits. No
mass or hydronephrosis visualized.

Left Kidney: Length: 9.9 cm. Echogenicity within normal limits. No
mass or hydronephrosis visualized.

Abdominal aorta: No aneurysm visualized.

Other findings: None.
IMPRESSION: No abnormality seen in the abdomen.

## 2016-05-19 DIAGNOSIS — H4032X3 Glaucoma secondary to eye trauma, left eye, severe stage: Secondary | ICD-10-CM | POA: Diagnosis not present

## 2016-05-27 ENCOUNTER — Ambulatory Visit (INDEPENDENT_AMBULATORY_CARE_PROVIDER_SITE_OTHER): Payer: BLUE CROSS/BLUE SHIELD | Admitting: Family Medicine

## 2016-05-27 ENCOUNTER — Encounter: Payer: Self-pay | Admitting: Family Medicine

## 2016-05-27 VITALS — BP 135/79 | HR 78 | Temp 97.2°F | Ht 76.0 in | Wt 188.8 lb

## 2016-05-27 DIAGNOSIS — Z0000 Encounter for general adult medical examination without abnormal findings: Secondary | ICD-10-CM | POA: Diagnosis not present

## 2016-05-27 DIAGNOSIS — F411 Generalized anxiety disorder: Secondary | ICD-10-CM

## 2016-05-27 MED ORDER — LORAZEPAM 0.5 MG PO TABS: 0.5000 mg | ORAL_TABLET | Freq: Two times a day (BID) | ORAL | 1 refills | Status: DC | PRN

## 2016-05-27 NOTE — Progress Notes (Signed)
   HPI  Patient presents today for annual physical exam and anxiety.  Patient in good health and has no complaints. He states his only used a few pills of Ativan in the last year. He does not have daily issues of anxiety, however he has used Ativan intermittently for years and would like a refill.  He exercises by walking about 3 miles 3 or 4 times a week. He touches his diet, limiting portion control as well as avoiding fried and fatty foods.  He denies any urinary symptoms. He also gets his labs done every year at work, they check PSAs. He has not had labs this year.  PMH: Smoking status noted ROS: Per HPI  Objective: BP 135/79   Pulse 78   Temp 97.2 F (36.2 C) (Oral)   Ht 6' 4" (1.93 m)   Wt 188 lb 12.8 oz (85.6 kg)   BMI 22.98 kg/m  Gen: NAD, alert, cooperative with exam HEENT: NCAT, EOMI,leftward gaze of the left eye with asymmetric pupil, left ear obscured by cerumen, right TM within normal limits, nares clear, oropharynx clear Neck: No bruit CV: RRR, good S1/S2, no murmur Resp: CTABL, no wheezes, non-labored Abd: SNTND, BS present, no guarding or organomegaly Ext: No edema, warm Neuro: Alert and oriented, 2+ symmetric patellar tendon reflexes bilaterally, strength 5/5 in bilateral lower extremities  Assessment and plan:  # Annual physical exam Normal exam Labs Discussed diet and exercise, encouraged him to continue his good habits.  # Generalized anxiety disorder Stable Very intermittent symptoms, refilled Ativan    Orders Placed This Encounter  Procedures  . CMP14+EGFR  . CBC with Differential  . TSH  . Lipid panel  . Hepatitis C antibody    Meds ordered this encounter  Medications  . LORazepam (ATIVAN) 0.5 MG tablet    Sig: Take 1 tablet (0.5 mg total) by mouth every 12 (twelve) hours as needed for anxiety.    Dispense:  30 tablet    Refill:  1    Sam Bradshaw, MD [facility] 05/27/2016, 10:27 AM     

## 2016-05-27 NOTE — Patient Instructions (Signed)
Great to see you!  Lets see ou again in 1 year unless you ned us sooner.

## 2016-05-28 LAB — CBC WITH DIFFERENTIAL/PLATELET
BASOS ABS: 0 10*3/uL (ref 0.0–0.2)
Basos: 1 %
EOS (ABSOLUTE): 0.2 10*3/uL (ref 0.0–0.4)
Eos: 3 %
Hematocrit: 44.6 % (ref 37.5–51.0)
Hemoglobin: 15.2 g/dL (ref 12.6–17.7)
IMMATURE GRANULOCYTES: 0 %
Immature Grans (Abs): 0 10*3/uL (ref 0.0–0.1)
LYMPHS ABS: 1.4 10*3/uL (ref 0.7–3.1)
Lymphs: 25 %
MCH: 31 pg (ref 26.6–33.0)
MCHC: 34.1 g/dL (ref 31.5–35.7)
MCV: 91 fL (ref 79–97)
MONOS ABS: 0.6 10*3/uL (ref 0.1–0.9)
Monocytes: 10 %
Neutrophils Absolute: 3.4 10*3/uL (ref 1.4–7.0)
Neutrophils: 61 %
PLATELETS: 257 10*3/uL (ref 150–379)
RBC: 4.9 x10E6/uL (ref 4.14–5.80)
RDW: 12.8 % (ref 12.3–15.4)
WBC: 5.6 10*3/uL (ref 3.4–10.8)

## 2016-05-28 LAB — CMP14+EGFR
A/G RATIO: 1.8 (ref 1.2–2.2)
ALBUMIN: 4.8 g/dL (ref 3.5–5.5)
ALK PHOS: 59 IU/L (ref 39–117)
ALT: 17 IU/L (ref 0–44)
AST: 23 IU/L (ref 0–40)
BILIRUBIN TOTAL: 0.8 mg/dL (ref 0.0–1.2)
BUN / CREAT RATIO: 15 (ref 9–20)
BUN: 14 mg/dL (ref 6–24)
CHLORIDE: 100 mmol/L (ref 96–106)
CO2: 26 mmol/L (ref 18–29)
Calcium: 9.4 mg/dL (ref 8.7–10.2)
Creatinine, Ser: 0.94 mg/dL (ref 0.76–1.27)
GFR calc Af Amer: 103 mL/min/{1.73_m2} (ref >59)
GFR calc non Af Amer: 89 mL/min/{1.73_m2} (ref >59)
GLOBULIN, TOTAL: 2.7 g/dL (ref 1.5–4.5)
Glucose: 84 mg/dL (ref 65–99)
POTASSIUM: 4.8 mmol/L (ref 3.5–5.2)
SODIUM: 142 mmol/L (ref 134–144)
Total Protein: 7.5 g/dL (ref 6.0–8.5)

## 2016-05-28 LAB — LIPID PANEL
CHOL/HDL RATIO: 4.2 ratio (ref 0.0–5.0)
CHOLESTEROL TOTAL: 182 mg/dL (ref 100–199)
HDL: 43 mg/dL (ref >39)
LDL CALC: 127 mg/dL — AB (ref 0–99)
TRIGLYCERIDES: 58 mg/dL (ref 0–149)
VLDL Cholesterol Cal: 12 mg/dL (ref 5–40)

## 2016-05-28 LAB — TSH: TSH: 1.55 u[IU]/mL (ref 0.450–4.500)

## 2016-05-28 LAB — HEPATITIS C ANTIBODY

## 2016-05-30 NOTE — Progress Notes (Signed)
Patient aware.

## 2016-06-11 DIAGNOSIS — Z139 Encounter for screening, unspecified: Secondary | ICD-10-CM | POA: Diagnosis not present

## 2016-06-11 DIAGNOSIS — E782 Mixed hyperlipidemia: Secondary | ICD-10-CM | POA: Diagnosis not present

## 2016-06-25 DIAGNOSIS — E782 Mixed hyperlipidemia: Secondary | ICD-10-CM | POA: Diagnosis not present

## 2016-06-25 DIAGNOSIS — R7301 Impaired fasting glucose: Secondary | ICD-10-CM | POA: Diagnosis not present

## 2016-06-25 DIAGNOSIS — F419 Anxiety disorder, unspecified: Secondary | ICD-10-CM | POA: Diagnosis not present

## 2016-06-25 DIAGNOSIS — Z008 Encounter for other general examination: Secondary | ICD-10-CM | POA: Diagnosis not present

## 2016-09-11 DIAGNOSIS — H4032X3 Glaucoma secondary to eye trauma, left eye, severe stage: Secondary | ICD-10-CM | POA: Diagnosis not present

## 2017-02-03 DIAGNOSIS — H401131 Primary open-angle glaucoma, bilateral, mild stage: Secondary | ICD-10-CM | POA: Diagnosis not present

## 2017-03-13 DIAGNOSIS — H4032X3 Glaucoma secondary to eye trauma, left eye, severe stage: Secondary | ICD-10-CM | POA: Diagnosis not present

## 2017-05-29 ENCOUNTER — Ambulatory Visit (INDEPENDENT_AMBULATORY_CARE_PROVIDER_SITE_OTHER): Payer: BLUE CROSS/BLUE SHIELD | Admitting: Family Medicine

## 2017-05-29 ENCOUNTER — Encounter: Payer: Self-pay | Admitting: Family Medicine

## 2017-05-29 VITALS — BP 128/73 | HR 88 | Temp 97.4°F | Ht 76.0 in | Wt 192.0 lb

## 2017-05-29 DIAGNOSIS — Z0000 Encounter for general adult medical examination without abnormal findings: Secondary | ICD-10-CM | POA: Diagnosis not present

## 2017-05-29 DIAGNOSIS — F411 Generalized anxiety disorder: Secondary | ICD-10-CM

## 2017-05-29 MED ORDER — LORAZEPAM 0.5 MG PO TABS: 0.5000 mg | ORAL_TABLET | Freq: Two times a day (BID) | ORAL | 1 refills | Status: DC | PRN

## 2017-05-29 NOTE — Progress Notes (Signed)
   HPI  Patient presents today here for a no physical exam.  Patient also needs refill of lorazepam. Patient uses approximately 45 pills a year. He states that his anxiety is very well under control and he denies any suicidal thoughts. He states that he really keeps lorazepam around as a safety precaution for panic attacks.  Patient watches his diet moderately. He's occupationally active and he walks frequently at home, usually approximately 3 miles at a time.    PMH: Smoking status noted ROS: Per HPI  Objective: BP 128/73 (BP Location: Right Arm)   Pulse 88   Temp (!) 97.4 F (36.3 C) (Oral)   Ht 6' 4" (1.93 m)   Wt 192 lb (87.1 kg)   BMI 23.37 kg/m  Gen: NAD, alert, cooperative with exam HEENT: NCAT, left eye with downward and outward gaze, discordant gaze, PERRLA, EOMI, TMs obscured by cerumen partially bilaterally, nares clear, oropharynx clear CV: RRR, good S1/S2, no murmur Resp: CTABL, no wheezes, non-labored Abd: No visible scars or abnormalities observed Ext: No edema, warm Neuro: Alert and oriented, No gross deficits  Assessment and plan:  # Annual physical exam Normal exam Discussed healthy lifestyle choices Labs, discussed PSA at length. He is asymptomatic.  # Anxiety, generalized anxiety disorder Stable, doing very well Refill lorazepam, patient only filled 1 prescription last year and use the medication very rarely. [place] controlled substance database reviewed with no refills in the last 6 months and one narcotic refill from a dentist that appears very appropriate.  Orders Placed This Encounter  Procedures  . PSA  . CBC with Differential/Platelet  . CMP14+EGFR  . Lipid panel  . TSH    Meds ordered this encounter  Medications  . LORazepam (ATIVAN) 0.5 MG tablet    Sig: Take 1 tablet (0.5 mg total) by mouth every 12 (twelve) hours as needed for anxiety.    Dispense:  30 tablet    Refill:  1    Sam Bradshaw, MD [facility] 05/29/2017, 10:11 AM     

## 2017-05-29 NOTE — Patient Instructions (Signed)
Great to see you!  We will send a letter, call, or send labs on mychart within 1 week. Please contact us with any concerns.

## 2017-05-30 LAB — CBC WITH DIFFERENTIAL/PLATELET
BASOS ABS: 0 10*3/uL (ref 0.0–0.2)
Basos: 1 %
EOS (ABSOLUTE): 0.1 10*3/uL (ref 0.0–0.4)
Eos: 1 %
Hematocrit: 44.6 % (ref 37.5–51.0)
Hemoglobin: 15.3 g/dL (ref 13.0–17.7)
IMMATURE GRANS (ABS): 0 10*3/uL (ref 0.0–0.1)
IMMATURE GRANULOCYTES: 0 %
LYMPHS: 29 %
Lymphocytes Absolute: 1.8 10*3/uL (ref 0.7–3.1)
MCH: 31.3 pg (ref 26.6–33.0)
MCHC: 34.3 g/dL (ref 31.5–35.7)
MCV: 91 fL (ref 79–97)
MONOCYTES: 8 %
Monocytes Absolute: 0.5 10*3/uL (ref 0.1–0.9)
NEUTROS PCT: 61 %
Neutrophils Absolute: 3.7 10*3/uL (ref 1.4–7.0)
PLATELETS: 260 10*3/uL (ref 150–379)
RBC: 4.89 x10E6/uL (ref 4.14–5.80)
RDW: 13 % (ref 12.3–15.4)
WBC: 6.1 10*3/uL (ref 3.4–10.8)

## 2017-05-30 LAB — CMP14+EGFR
ALBUMIN: 4.8 g/dL (ref 3.5–5.5)
ALT: 18 IU/L (ref 0–44)
AST: 22 IU/L (ref 0–40)
Albumin/Globulin Ratio: 1.8 (ref 1.2–2.2)
Alkaline Phosphatase: 61 IU/L (ref 39–117)
BILIRUBIN TOTAL: 0.8 mg/dL (ref 0.0–1.2)
BUN/Creatinine Ratio: 13 (ref 9–20)
BUN: 13 mg/dL (ref 6–24)
CALCIUM: 9.3 mg/dL (ref 8.7–10.2)
CO2: 22 mmol/L (ref 20–29)
CREATININE: 0.99 mg/dL (ref 0.76–1.27)
Chloride: 100 mmol/L (ref 96–106)
GFR calc non Af Amer: 83 mL/min/{1.73_m2} (ref >59)
GFR, EST AFRICAN AMERICAN: 96 mL/min/{1.73_m2} (ref >59)
GLUCOSE: 92 mg/dL (ref 65–99)
Globulin, Total: 2.7 g/dL (ref 1.5–4.5)
Potassium: 4 mmol/L (ref 3.5–5.2)
Sodium: 138 mmol/L (ref 134–144)
Total Protein: 7.5 g/dL (ref 6.0–8.5)

## 2017-05-30 LAB — LIPID PANEL
CHOLESTEROL TOTAL: 166 mg/dL (ref 100–199)
Chol/HDL Ratio: 3.8 ratio (ref 0.0–5.0)
HDL: 44 mg/dL (ref >39)
LDL CALC: 111 mg/dL — AB (ref 0–99)
TRIGLYCERIDES: 53 mg/dL (ref 0–149)
VLDL CHOLESTEROL CAL: 11 mg/dL (ref 5–40)

## 2017-05-30 LAB — TSH: TSH: 1.74 u[IU]/mL (ref 0.450–4.500)

## 2017-05-30 LAB — PSA: PROSTATE SPECIFIC AG, SERUM: 0.3 ng/mL (ref 0.0–4.0)

## 2017-06-02 ENCOUNTER — Encounter: Payer: Self-pay | Admitting: Family Medicine

## 2017-06-08 DIAGNOSIS — Z719 Counseling, unspecified: Secondary | ICD-10-CM | POA: Diagnosis not present

## 2017-06-08 DIAGNOSIS — Z139 Encounter for screening, unspecified: Secondary | ICD-10-CM | POA: Diagnosis not present

## 2017-06-08 DIAGNOSIS — E782 Mixed hyperlipidemia: Secondary | ICD-10-CM | POA: Diagnosis not present

## 2017-06-08 DIAGNOSIS — R7301 Impaired fasting glucose: Secondary | ICD-10-CM | POA: Diagnosis not present

## 2017-06-08 DIAGNOSIS — F419 Anxiety disorder, unspecified: Secondary | ICD-10-CM | POA: Diagnosis not present

## 2017-06-08 DIAGNOSIS — Z008 Encounter for other general examination: Secondary | ICD-10-CM | POA: Diagnosis not present

## 2017-06-22 DIAGNOSIS — Z139 Encounter for screening, unspecified: Secondary | ICD-10-CM | POA: Diagnosis not present

## 2018-06-14 DIAGNOSIS — R7301 Impaired fasting glucose: Secondary | ICD-10-CM | POA: Diagnosis not present

## 2018-06-14 DIAGNOSIS — E782 Mixed hyperlipidemia: Secondary | ICD-10-CM | POA: Diagnosis not present

## 2018-06-14 DIAGNOSIS — Z008 Encounter for other general examination: Secondary | ICD-10-CM | POA: Diagnosis not present

## 2018-06-14 DIAGNOSIS — Z139 Encounter for screening, unspecified: Secondary | ICD-10-CM | POA: Diagnosis not present

## 2018-06-14 DIAGNOSIS — F419 Anxiety disorder, unspecified: Secondary | ICD-10-CM | POA: Diagnosis not present

## 2018-06-15 ENCOUNTER — Ambulatory Visit: Payer: BLUE CROSS/BLUE SHIELD | Admitting: Family Medicine

## 2018-06-15 ENCOUNTER — Encounter: Payer: Self-pay | Admitting: Family Medicine

## 2018-06-15 VITALS — BP 134/75 | HR 88 | Temp 98.5°F | Ht 76.0 in | Wt 197.0 lb

## 2018-06-15 DIAGNOSIS — H6123 Impacted cerumen, bilateral: Secondary | ICD-10-CM | POA: Diagnosis not present

## 2018-06-15 DIAGNOSIS — F411 Generalized anxiety disorder: Secondary | ICD-10-CM | POA: Diagnosis not present

## 2018-06-15 DIAGNOSIS — Z79899 Other long term (current) drug therapy: Secondary | ICD-10-CM

## 2018-06-15 MED ORDER — LORAZEPAM 0.5 MG PO TABS: 0.5000 mg | ORAL_TABLET | Freq: Two times a day (BID) | ORAL | 1 refills | Status: DC | PRN

## 2018-06-15 NOTE — Patient Instructions (Addendum)
Follow up with me for annual exam.  We will check fasting labs.  Earwax Buildup, Adult The ears produce a substance called earwax that helps keep bacteria out of the ear and protects the skin in the ear canal. Occasionally, earwax can build up in the ear and cause discomfort or hearing loss. What increases the risk? This condition is more likely to develop in people who:  Are male.  Are elderly.  Naturally produce more earwax.  Clean their ears often with cotton swabs.  Use earplugs often.  Use in-ear headphones often.  Wear hearing aids.  Have narrow ear canals.  Have earwax that is overly thick or sticky.  Have eczema.  Are dehydrated.  Have excess hair in the ear canal.  What are the signs or symptoms? Symptoms of this condition include:  Reduced or muffled hearing.  A feeling of fullness in the ear or feeling that the ear is plugged.  Fluid coming from the ear.  Ear pain.  Ear itch.  Ringing in the ear.  Coughing.  An obvious piece of earwax that can be seen inside the ear canal.  How is this diagnosed? This condition may be diagnosed based on:  Your symptoms.  Your medical history.  An ear exam. During the exam, your health care provider will look into your ear with an instrument called an otoscope.  You may have tests, including a hearing test. How is this treated? This condition may be treated by:  Using ear drops to soften the earwax.  Having the earwax removed by a health care provider. The health care provider may: ? Flush the ear with water. ? Use an instrument that has a loop on the end (curette). ? Use a suction device.  Surgery to remove the wax buildup. This may be done in severe cases.  Follow these instructions at home:  Take over-the-counter and prescription medicines only as told by your health care provider.  Do not put any objects, including cotton swabs, into your ear. You can clean the opening of your ear canal with a  washcloth or facial tissue.  Follow instructions from your health care provider about cleaning your ears. Do not over-clean your ears.  Drink enough fluid to keep your urine clear or pale yellow. This will help to thin the earwax.  Keep all follow-up visits as told by your health care provider. If earwax builds up in your ears often or if you use hearing aids, consider seeing your health care provider for routine, preventive ear cleanings. Ask your health care provider how often you should schedule your cleanings.  If you have hearing aids, clean them according to instructions from the manufacturer and your health care provider. Contact a health care provider if:  You have ear pain.  You develop a fever.  You have blood, pus, or other fluid coming from your ear.  You have hearing loss.  You have ringing in your ears that does not go away.  Your symptoms do not improve with treatment.  You feel like the room is spinning (vertigo). Summary  Earwax can build up in the ear and cause discomfort or hearing loss.  The most common symptoms of this condition include reduced or muffled hearing and a feeling of fullness in the ear or feeling that the ear is plugged.  This condition may be diagnosed based on your symptoms, your medical history, and an ear exam.  This condition may be treated by using ear drops to soften the  earwax or by having the earwax removed by a health care provider.  Do not put any objects, including cotton swabs, into your ear. You can clean the opening of your ear canal with a washcloth or facial tissue. This information is not intended to replace advice given to you by your health care provider. Make sure you discuss any questions you have with your health care provider. Document Released: 10/23/2004 Document Revised: 11/26/2016 Document Reviewed: 11/26/2016 Elsevier Interactive Patient Education  [employer]2018 [employer]vier Inc[employer]

## 2018-06-15 NOTE — Progress Notes (Signed)
Subjective: CC: cerumen PCP: Raliegh Ip, DO ZOX:WRUEAVW Lane Jay is a 62 y.o. male presenting to clinic today for:  1. Cerumen Patient reports that he saw the practitioner at his work recently who noted that he had bilateral cerumen impaction.  He presents today for further evaluation.  He reports chronic right-sided decreased hearing since childhood.   Denies any ear pain,, fluid from the ear or fevers.  He uses earplugs at work for hearing protection daily.  2.  Anxiety disorder Patient reports history of panic disorder for many years.  He reports having been put on a benzodiazepine many years ago.  He states he has perhaps a 1-2 episodes per year.  He thinks he only had one in this last year, as he still has a full bottle of lorazepam at home.  Denies any excessive sedation, difficulty with breathing while using medication.  No visual or auditory hallucinations.  No history of suicidal or homicidal ideation.  No history of hospitalizations for mental health disorder.  Family history significant for anxiety and panic in his mother.  ROS: Per HPI  No Known Allergies Past Medical History:  Diagnosis Date  . Anxiety   . Glaucoma     Current Outpatient Medications:  .  LORazepam (ATIVAN) 0.5 MG tablet, Take 1 tablet (0.5 mg total) by mouth every 12 (twelve) hours as needed for anxiety., Disp: 30 tablet, Rfl: 1 Social History   Socioeconomic History  . Marital status: Married    Spouse name: Not on file  . Number of children: Not on file  . Years of education: Not on file  . Highest education level: Not on file  Occupational History  . Not on file  Social Needs  . Financial resource strain: Not on file  . Food insecurity:    Worry: Not on file    Inability: Not on file  . Transportation needs:    Medical: Not on file    Non-medical: Not on file  Tobacco Use  . Smoking status: Never Smoker  . Smokeless tobacco: Never Used  Substance and Sexual Activity  .  Alcohol use: Yes    Comment: occasionally  . Drug use: No  . Sexual activity: Not on file  Lifestyle  . Physical activity:    Days per week: Not on file    Minutes per session: Not on file  . Stress: Not on file  Relationships  . Social connections:    Talks on phone: Not on file    Gets together: Not on file    Attends religious service: Not on file    Active member of club or organization: Not on file    Attends meetings of clubs or organizations: Not on file    Relationship status: Not on file  . Intimate partner violence:    Fear of current or ex partner: Not on file    Emotionally abused: Not on file    Physically abused: Not on file    Forced sexual activity: Not on file  Other Topics Concern  . Not on file  Social History Narrative  . Not on file   Family History  Problem Relation Age of Onset  . Diabetes Mother   . Hypertension Mother   . Hypertension Father     Objective: Office vital signs reviewed. BP 134/75 (BP Location: Left Arm, Patient Position: Sitting, Cuff Size: Normal)   Pulse 88   Temp 98.5 F (36.9 C) (Oral)   Ht 6\' 4"  (  1.93 m)   Wt 197 lb (89.4 kg)   BMI 23.98 kg/m   Physical Examination:  General: Awake, alert, well nourished, No acute distress HEENT: Normal    Neck: No masses palpated. No lymphadenopathy    Ears: Tympanic membranes obscured by cerumen bilaterally.    Eyes: esotropia of left eye noted. Psych: Mood stable, speech normal, affect appropriate, pleasant and interactive.  Does not appear to be responding to internal stimuli.  [facility] 06/15/2018 05/29/2017 05/27/2016  Decreased Interest 0 0 0  Down, Depressed, Hopeless 0 0 0  PHQ - 2 Score 0 0 0  Altered sleeping 0 - -  Tired, decreased energy 0 - -  Change in appetite 0 - -  Feeling bad or failure about yourself  0 - -  Trouble concentrating 0 - -  Moving slowly or fidgety/restless 0 - -  Suicidal thoughts 0 - -  PHQ-9 Score 0 - -   GAD 7 : Generalized  Anxiety Score 06/15/2018  Nervous, Anxious, on Edge 0  Control/stop worrying 0  Worry too much - different things 0  Trouble relaxing 0  Restless 0  Easily annoyed or irritable 0  Afraid - awful might happen 0  Total GAD 7 Score 0    Assessment/ Plan: 62 y.o. male   1. Bilateral impacted cerumen Left ear successfully irrigated.  No evidence of middle ear or external ear infection.  Right ear required an additional 5 drops of Debrox and then was able to successfully be irrigated.  Again no evidence of middle or external ear infection.  May use mineral oil applied to external auditory canal if he develops itching or dry skin.  2. GAD (generalized anxiety disorder) National narcotic database was reviewed.  Patient seems to be using the medication very appropriately and has not had a refill since September of last year.  I agree that he does not require a daily medication given very few panic attacks per year.  Should he start having more per year, will discuss SSRI initiation.  Otherwise may continue to use Ativan as needed.  Controlled substance contract was reviewed and signed by the patient today.  He will follow-up with me for full physical exam.  He will bring his fasting labs from work to that appointment. - LORazepam (ATIVAN) 0.5 MG tablet; Take 1 tablet (0.5 mg total) by mouth every 12 (twelve) hours as needed for anxiety.  Dispense: 30 tablet; Refill: 1  3. Controlled substance agreement signed Scanned into EMR   Meds ordered this encounter  Medications  . LORazepam (ATIVAN) 0.5 MG tablet    Sig: Take 1 tablet (0.5 mg total) by mouth every 12 (twelve) hours as needed for anxiety.    Dispense:  30 tablet    Refill:  1     Jay Bjelland Hulen SkainsM Decklyn Hyder, DO [facility] [phone]

## 2018-06-28 DIAGNOSIS — E782 Mixed hyperlipidemia: Secondary | ICD-10-CM | POA: Diagnosis not present

## 2018-06-28 DIAGNOSIS — R7301 Impaired fasting glucose: Secondary | ICD-10-CM | POA: Diagnosis not present

## 2018-07-14 DIAGNOSIS — H40013 Open angle with borderline findings, low risk, bilateral: Secondary | ICD-10-CM | POA: Diagnosis not present

## 2018-08-12 DIAGNOSIS — H40023 Open angle with borderline findings, high risk, bilateral: Secondary | ICD-10-CM | POA: Diagnosis not present

## 2018-08-20 ENCOUNTER — Other Ambulatory Visit: Payer: Self-pay | Admitting: Family Medicine

## 2018-08-20 DIAGNOSIS — F411 Generalized anxiety disorder: Secondary | ICD-10-CM

## 2018-08-23 NOTE — Telephone Encounter (Signed)
The last I spoke to this patient he was only using this medication 1 to 2 times per year.  I gave him more than enough last visit.  I'd like to see him back in office if he is having an increased need for this medication.

## 2018-09-02 DIAGNOSIS — H40033 Anatomical narrow angle, bilateral: Secondary | ICD-10-CM | POA: Diagnosis not present

## 2018-12-02 DIAGNOSIS — H40011 Open angle with borderline findings, low risk, right eye: Secondary | ICD-10-CM | POA: Diagnosis not present

## 2018-12-02 DIAGNOSIS — H4032X4 Glaucoma secondary to eye trauma, left eye, indeterminate stage: Secondary | ICD-10-CM | POA: Diagnosis not present

## 2018-12-06 ENCOUNTER — Encounter: Payer: Self-pay | Admitting: Family Medicine

## 2018-12-06 ENCOUNTER — Ambulatory Visit: Payer: BLUE CROSS/BLUE SHIELD | Admitting: Family Medicine

## 2018-12-06 VITALS — BP 138/81 | HR 75 | Temp 97.7°F | Ht 76.0 in | Wt 198.0 lb

## 2018-12-06 DIAGNOSIS — G243 Spasmodic torticollis: Secondary | ICD-10-CM

## 2018-12-06 MED ORDER — TRAMADOL HCL 50 MG PO TABS: 50.0000 mg | ORAL_TABLET | Freq: Four times a day (QID) | ORAL | 0 refills | Status: AC

## 2018-12-06 MED ORDER — PREDNISONE 10 MG PO TABS: ORAL_TABLET | ORAL | 0 refills | Status: DC

## 2018-12-06 NOTE — Progress Notes (Signed)
Chief Complaint  Patient presents with  . Neck Pain    pt here today c/o "stabbing"pain on the right side of the neck x 2 days without known cause    HPI  Patient presents today for stabbing pain at the posterior right neck.  Onset was yesterday.  The day before he had been cleaning up his car doing a lot of rubbing and wiping with his right arm.  Yesterday morning he woke up with what he described as a crick at the back of his neck.  It was better later in the day for a while but then he started developing a stabbing pain.  This was located at the right posterior neck right at the base of the skull.  This morning he tried to drive to work and could not turn his neck without having the stabbing pain at 10/10.  It hits randomly as well.  He has an ache in between the stabbing sensation that is only slightly better than the 10.  PMH: Smoking status noted ROS: Per HPI  Objective: BP 138/81   Pulse 75   Temp 97.7 F (36.5 C) (Oral)   Ht 6\' 4"  (1.93 m)   Wt 198 lb (89.8 kg)   BMI 24.10 kg/m  Gen: NAD, alert, cooperative with exam. OBvious pain. HEENT: NCAT, EOMI, PERRL.  There is tension noted in the right posterior cervicalis muscles at the nuchal ridge.  There is reproduction of the stabbing pain to deep palpation of the trapezius origin at the base of the skull /Nuchal ridge. CV: RRR, good S1/S2, no murmur Resp: CTABL, no wheezes, non-labored Ext: No edema, warm Neuro: Alert and oriented, No gross deficits  Assessment and plan:  1. Torticollis, spasmodic     Meds ordered this encounter  Medications  . predniSONE (DELTASONE) 10 MG tablet    Sig: Take 5 daily for 2 days followed by 4,3,2 and 1 for 2 days each.    Dispense:  30 tablet    Refill:  0  . traMADol (ULTRAM) 50 MG tablet    Sig: Take 1 tablet (50 mg total) by mouth 4 (four) times daily for 5 days. 1-2 tablets up to 4 times a day as needed for pain    Dispense:  20 tablet    Refill:  0    No orders of the defined  types were placed in this encounter.   Follow up as needed.  Mechele Claude, MD

## 2018-12-08 ENCOUNTER — Telehealth: Payer: Self-pay | Admitting: Family Medicine

## 2018-12-08 NOTE — Telephone Encounter (Signed)
That sounds reasonable. Please write it for him. Thanks, WS

## 2018-12-08 NOTE — Telephone Encounter (Signed)
Letter ready for pick up.  Patient aware  

## 2019-02-14 ENCOUNTER — Encounter: Payer: Self-pay | Admitting: Internal Medicine

## 2019-03-09 DIAGNOSIS — H40021 Open angle with borderline findings, high risk, right eye: Secondary | ICD-10-CM | POA: Diagnosis not present

## 2020-09-24 ENCOUNTER — Other Ambulatory Visit: Payer: Self-pay

## 2020-09-24 ENCOUNTER — Ambulatory Visit (INDEPENDENT_AMBULATORY_CARE_PROVIDER_SITE_OTHER): Payer: 59 | Admitting: Family Medicine

## 2020-09-24 DIAGNOSIS — R509 Fever, unspecified: Secondary | ICD-10-CM

## 2020-09-24 NOTE — Progress Notes (Signed)
Subjective:    Patient ID: Jay Larsen, male    DOB: 05/29/1956, 64 y.o.   MRN: [id number]   HPI: Jay Larsen is a 64 y.o. male presenting for onset 1 week ago of fever to 100.2, malaise. Ibuprofen lowered to 99.5. Was better for 2 days, then went back up to 100. Went up again this AM 100.3.Coughing and head and chest are congested. Having a few body aches. Stomach queasy. Able to drink water. Unable to eat food except a bowl of cereal this AM. Has not had vaccine or test    Depression screen [facility] 2/9 12/06/2018 06/15/2018 05/29/2017 05/27/2016 05/10/2015  Decreased Interest 0 0 0 0 0  Down, Depressed, Hopeless 0 0 0 0 0  PHQ - 2 Score 0 0 0 0 0  Altered sleeping - 0 - - -  Tired, decreased energy - 0 - - -  Change in appetite - 0 - - -  Feeling bad or failure about yourself  - 0 - - -  Trouble concentrating - 0 - - -  Moving slowly or fidgety/restless - 0 - - -  Suicidal thoughts - 0 - - -  PHQ-9 Score - 0 - - -     Relevant past medical, surgical, family and social history reviewed and updated as indicated.  Interim medical history since our last visit reviewed. Allergies and medications reviewed and updated.  ROS:  Review of Systems  Constitutional: Negative for fever.  Respiratory: Negative for shortness of breath.   Cardiovascular: Negative for chest pain.  Musculoskeletal: Negative for arthralgias.  Skin: Negative for rash.     Social History   Tobacco Use  Smoking Status Never Smoker  Smokeless Tobacco Never Used       Objective:     Wt Readings from Last 3 Encounters:  12/06/18 198 lb (89.8 kg)  06/15/18 197 lb (89.4 kg)  05/29/17 192 lb (87.1 kg)     Exam deferred. Pt. Harboring due to COVID 19. Phone visit performed.   Assessment & Plan:   1. Fever, unspecified fever cause     No orders of the defined types were placed in this encounter.   Orders Placed This Encounter  Procedures   Novel Coronavirus, NAA ([facility])    Order  Specific Question:   Is this test for diagnosis or screening    Answer:   Diagnosis of ill patient    Order Specific Question:   Symptomatic for COVID-19 as defined by CDC    Answer:   Yes    Order Specific Question:   Date of Symptom Onset    Answer:   09/17/2020    Order Specific Question:   Hospitalized for COVID-19    Answer:   No    Order Specific Question:   Admitted to ICU for COVID-19    Answer:   No    Order Specific Question:   Previously tested for COVID-19    Answer:   No    Order Specific Question:   Resident in a congregate (group) care setting    Answer:   No    Order Specific Question:   Is the patient student?    Answer:   No    Order Specific Question:   Employed in healthcare setting    Answer:   No    Order Specific Question:   Has patient completed COVID vaccination(s) (2 doses of Pfizer/Moderna 1 dose of [employer])  Answer:   No    Order Specific Question:   Release to patient    Answer:   Immediate   SARS-COV-2, NAA 2 DAY TAT      Diagnoses and all orders for this visit:  Fever, unspecified fever cause -     Novel Coronavirus, NAA ([facility])  Other orders -     SARS-COV-2, NAA 2 DAY TAT    Virtual Visit via telephone Note  I discussed the limitations, risks, security and privacy concerns of performing an evaluation and management service by telephone and the availability of in person appointments. The patient was identified with two identifiers. Pt.expressed understanding and agreed to proceed. Pt. Is at home. Dr. Darlyn Read is in his office.  Follow Up Instructions:   I discussed the assessment and treatment plan with the patient. The patient was provided an opportunity to ask questions and all were answered. The patient agreed with the plan and demonstrated an understanding of the instructions.   The patient was advised to call back or seek an in-person evaluation if the symptoms worsen or if the condition fails to improve as  anticipated.   Total minutes including chart review and phone contact time: 18   Follow up plan: No follow-ups on file.  Mechele Claude, MD [facility]

## 2020-09-25 LAB — NOVEL CORONAVIRUS, NAA: SARS-CoV-2, NAA: DETECTED — AB

## 2020-09-25 LAB — SARS-COV-2, NAA 2 DAY TAT

## 2020-09-26 ENCOUNTER — Encounter: Payer: Self-pay | Admitting: Family Medicine

## 2020-09-27 ENCOUNTER — Telehealth: Payer: Self-pay

## 2020-09-27 NOTE — Telephone Encounter (Signed)
Already spoke to patient he is aware of results and given infusion centers number to call and set up appointment.

## 2020-10-02 ENCOUNTER — Encounter: Payer: Self-pay | Admitting: Family Medicine

## 2020-10-02 ENCOUNTER — Other Ambulatory Visit: Payer: Self-pay

## 2020-10-02 ENCOUNTER — Ambulatory Visit (INDEPENDENT_AMBULATORY_CARE_PROVIDER_SITE_OTHER): Payer: 59 | Admitting: Family Medicine

## 2020-10-02 VITALS — Temp 99.2°F

## 2020-10-02 DIAGNOSIS — U071 COVID-19: Secondary | ICD-10-CM

## 2020-10-02 MED ORDER — BENZONATATE 100 MG PO CAPS: 100.0000 mg | ORAL_CAPSULE | Freq: Three times a day (TID) | ORAL | 1 refills | Status: DC | PRN

## 2020-10-02 MED ORDER — ONDANSETRON 4 MG PO TBDP: 4.0000 mg | ORAL_TABLET | Freq: Three times a day (TID) | ORAL | 0 refills | Status: DC | PRN

## 2020-10-02 NOTE — Progress Notes (Signed)
Telephone visit  Subjective: CC: COVID19 PCP: Raliegh Ip, DO [id number] Jay Larsen is a 65 y.o. male calls for telephone consult today. Patient provides verbal consent for consult held via phone.  Due to COVID-19 pandemic this visit was conducted virtually. This visit type was conducted due to national recommendations for restrictions regarding the COVID-19 Pandemic (e.g. social distancing, sheltering in place) in an effort to limit this patient's exposure and mitigate transmission in our community. All issues noted in this document were discussed and addressed.  A physical exam was not performed with this format.   Location of patient: home Location of provider: [facility] Others present for call: wife  1. COVID 19 infection Patient has been taking Mucinex D and Motrin.  No shortness of breath.  He reports nausea.  He reports productive cough that is not totally clear.  No hemoptysis, vomiting.  Diarrhea resolved.  Hydrating without difficulty.  He is taking vitamin supplements.  He is down 20 lbs since being sick.   ROS: Per HPI  No Known Allergies Past Medical History:  Diagnosis Date  . Anxiety   . Glaucoma     Current Outpatient Medications:  .  LORazepam (ATIVAN) 0.5 MG tablet, Take 1 tablet (0.5 mg total) by mouth every 12 (twelve) hours as needed for anxiety., Disp: 30 tablet, Rfl: 1  Assessment/ Plan: 65 y.o. male   COVID-19 virus infection - Plan: DG Chest 2 View  Does not sound like he is having a Covid pneumonia at this time but his wife is very worried therefore I have ordered a plain film.  [facility] will get this [facility] at [facility].  I have sent in cough suppressants.  We discussed hydration and bland foods.  Zofran sent for nausea.  Discussed red flag signs and symptoms warranting further evaluation emergently in the emergency department.  He voiced good understanding.  Otherwise I would like to see him back on Monday for recheck of weight and to run labs given  weight loss.  Meds ordered this encounter  Medications  . ondansetron (ZOFRAN ODT) 4 MG disintegrating tablet    Sig: Take 1 tablet (4 mg total) by mouth every 8 (eight) hours as needed for nausea or vomiting.    Dispense:  20 tablet    Refill:  0  . benzonatate (TESSALON PERLES) 100 MG capsule    Sig: Take 1 capsule (100 mg total) by mouth 3 (three) times daily as needed.    Dispense:  20 capsule    Refill:  1     Start time: 4:14pm End time: 4:24p  Total time spent on patient care (including telephone call/ virtual visit): 10 minutes  Cathlin Buchan Hulen Skains, DO [facility] [phone]

## 2020-10-08 ENCOUNTER — Encounter: Payer: Self-pay | Admitting: Family Medicine

## 2020-10-08 ENCOUNTER — Ambulatory Visit (INDEPENDENT_AMBULATORY_CARE_PROVIDER_SITE_OTHER): Payer: 59 | Admitting: Family Medicine

## 2020-10-08 ENCOUNTER — Ambulatory Visit (INDEPENDENT_AMBULATORY_CARE_PROVIDER_SITE_OTHER): Payer: 59

## 2020-10-08 VITALS — BP 133/86 | HR 116 | Temp 97.8°F | Resp 22 | Ht 76.0 in | Wt 198.0 lb

## 2020-10-08 DIAGNOSIS — J1282 Pneumonia due to coronavirus disease 2019: Secondary | ICD-10-CM | POA: Diagnosis not present

## 2020-10-08 DIAGNOSIS — R634 Abnormal weight loss: Secondary | ICD-10-CM | POA: Diagnosis not present

## 2020-10-08 DIAGNOSIS — U071 COVID-19: Secondary | ICD-10-CM | POA: Diagnosis not present

## 2020-10-08 DIAGNOSIS — Z79899 Other long term (current) drug therapy: Secondary | ICD-10-CM | POA: Diagnosis not present

## 2020-10-08 DIAGNOSIS — F411 Generalized anxiety disorder: Secondary | ICD-10-CM | POA: Diagnosis not present

## 2020-10-08 MED ORDER — PROMETHAZINE-DM 6.25-15 MG/5ML PO SYRP: 2.5000 mL | ORAL_SOLUTION | Freq: Four times a day (QID) | ORAL | 0 refills | Status: DC | PRN

## 2020-10-08 MED ORDER — METHYLPREDNISOLONE ACETATE 80 MG/ML IJ SUSP
80.0000 mg | Freq: Once | INTRAMUSCULAR | Status: AC
Start: 2020-10-08 — End: 2020-10-08
  Administered 2020-10-08: 80 mg via INTRAMUSCULAR

## 2020-10-08 MED ORDER — LORAZEPAM 0.5 MG PO TABS: 0.5000 mg | ORAL_TABLET | Freq: Two times a day (BID) | ORAL | 1 refills | Status: AC | PRN

## 2020-10-08 MED ORDER — AZITHROMYCIN 250 MG PO TABS: ORAL_TABLET | ORAL | 0 refills | Status: DC

## 2020-10-08 NOTE — Progress Notes (Signed)
Subjective: CC: COVID infection PCP: Janora Norlander, DO CWU:[id number] Jay Larsen is a 65 y.o. male presenting to clinic today for:  1.  COVID infection Patient was diagnosed with COVID infection around 27 December.  He reports persistent cough.  Denies any shortness of breath, wheezing.  Fever has been absent since Tuesday.  He has been hydrating without difficulty and trying to eat.  He discontinued the Zofran because it caused a headache.  He is using multiple OTC regimens.  Denies any hemoptysis or brown sputum.  2. anxiety disorder Patient with anxiety disorder.  He uses the Ativan intermittently as needed.  He needs a renewal of this medicine.  No excessive daytime sedation, falls or respiratory depression while on Ativan   ROS: Per HPI  No Known Allergies Past Medical History:  Diagnosis Date  . Anxiety   . Glaucoma     Current Outpatient Medications:  .  benzonatate (TESSALON PERLES) 100 MG capsule, Take 1 capsule (100 mg total) by mouth 3 (three) times daily as needed., Disp: 20 capsule, Rfl: 1 .  LORazepam (ATIVAN) 0.5 MG tablet, Take 1 tablet (0.5 mg total) by mouth every 12 (twelve) hours as needed for anxiety., Disp: 30 tablet, Rfl: 1 .  ondansetron (ZOFRAN ODT) 4 MG disintegrating tablet, Take 1 tablet (4 mg total) by mouth every 8 (eight) hours as needed for nausea or vomiting., Disp: 20 tablet, Rfl: 0 Social History   Socioeconomic History  . Marital status: Married    Spouse name: Not on file  . Number of children: Not on file  . Years of education: Not on file  . Highest education level: Not on file  Occupational History  . Not on file  Tobacco Use  . Smoking status: Never Smoker  . Smokeless tobacco: Never Used  Vaping Use  . Vaping Use: Never used  Substance and Sexual Activity  . Alcohol use: Yes    Comment: occasionally  . Drug use: No  . Sexual activity: Not on file  Other Topics Concern  . Not on file  Social History Narrative  . Not on  file   Social Determinants of Health   Financial Resource Strain: Not on file  Food Insecurity: Not on file  Transportation Needs: Not on file  Physical Activity: Not on file  Stress: Not on file  Social Connections: Not on file  Intimate Partner Violence: Not on file   Family History  Problem Relation Age of Onset  . Diabetes Mother   . Hypertension Mother   . Hypertension Father     Objective: Office vital signs reviewed. BP 133/86   Pulse (!) 116   Temp 97.8 F (36.6 C)   Resp (!) 22   Ht '6\' 4"'  (1.93 m)   Wt 198 lb (89.8 kg)   SpO2 91% Comment: walking  BMI 24.10 kg/m   Physical Examination:  General: Awake, alert, tired appearing, No acute distress HEENT: Normal, sclera white Cardio: Tachycardic, S1S2 heard, no murmurs appreciated Pulm: Rhonchorous breath sounds noted in the right lower lobe; normal work of breathing on room air at rest but did appear slightly dyspneic with exertion  DG Chest 2 View  Result Date: 10/08/2020 CLINICAL DATA:  Cough, COVID-19 infection. EXAM: CHEST - 2 VIEW COMPARISON:  March 29, 2014. FINDINGS: The heart size and mediastinal contours are within normal limits. No pneumothorax or pleural effusion is noted. Multiple patchy airspace opacities are noted bilaterally consistent with multifocal pneumonia. The visualized skeletal structures are  unremarkable. IMPRESSION: Bilateral multifocal pneumonia. Electronically Signed   By: Marijo Conception M.D.   On: 10/08/2020 16:44     Assessment/ Plan: 65 y.o. male   Pneumonia due to COVID-19 virus - Plan: DG Chest 2 View, promethazine-dextromethorphan (PROMETHAZINE-DM) 6.25-15 MG/5ML syrup, MyChart COVID-19 home monitoring program, methylPREDNISolone acetate (DEPO-MEDROL) injection 80 mg, azithromycin (ZITHROMAX Z-PAK) 250 MG tablet  Weight loss, non-intentional - Plan: CMP14+EGFR, CBC with Differential, TSH, DG Chest 2 View  GAD (generalized anxiety disorder) - Plan: Drug Screen 10 W/Conf, Se,  LORazepam (ATIVAN) 0.5 MG tablet  Controlled substance agreement signed - Plan: Drug Screen 10 W/Conf, Se  Chest x-ray did appear to have some streaking in the right middle lung fields.  Formal review by radiology confirms that he has multifocal pneumonia.  Z-Pak sent.  I am somewhat concerned that he has associated tachycardia.  He is afebrile and does not appear in distress.  We will see if we can treat him outpatient and have him back in the next 24 to 48 hours for recheck.  In the interim, I have given him a Depo-Medrol shot.  He did not desaturate below 91% on room air.  He understands red flag signs and symptoms warranting further evaluation.  However, he did appear slightly tachycardic.  Did not appear dehydrated, nor was he febrile.  I have obtained CBC with differential, CMP and TSH to further evaluate.  He understands red flag signs and symptoms warranting further evaluation emergency department  We also briefly discussed his anxiety last visit.  He updated his controlled substance contract today and a blood drug screen was obtained.   No orders of the defined types were placed in this encounter.  No orders of the defined types were placed in this encounter.    Janora Norlander, DO [place] [phone]

## 2020-10-08 NOTE — Patient Instructions (Signed)
You had labs performed today.  You will be contacted with the results of the labs once they are available, usually in the next 3 business days for routine lab work.  If you have an active my chart account, they will be released to your MyChart.  If you prefer to have these labs released to you via telephone, please let us know.  - Get plenty of rest and drink plenty of fluids. - Try to breathe moist air. Use a cold mist humidifier. - Consume warm fluids (soup or tea) to provide relief for a stuffy nose and to loosen phlegm. - For nasal stuffiness, try saline nasal spray or a Neti Pot. Afrin nasal spray can also be used but this product should not be used longer than 3 days or it will cause rebound nasal stuffiness (worsening nasal congestion). - For sore throat pain relief: use chloraseptic spray, suck on throat lozenges, hard candy or popsicles; gargle with warm salt water (1/4 tsp. salt per 8 oz. of water); and eat soft, bland foods. - Eat a well-balanced diet. If you cannot, ensure you are getting enough nutrients by taking a daily multivitamin. - Avoid dairy products, as they can thicken phlegm. - Avoid alcohol, as it impairs your body's immune system.  CONTACT YOUR DOCTOR IF YOU EXPERIENCE ANY OF THE FOLLOWING: - High fever - Ear pain - Sinus-type headache - Unusually severe cold symptoms - Cough that gets worse while other cold symptoms improve - Flare up of any chronic lung problem, such as asthma - Your symptoms persist longer than 2 weeks

## 2020-10-10 ENCOUNTER — Ambulatory Visit (INDEPENDENT_AMBULATORY_CARE_PROVIDER_SITE_OTHER): Payer: 59 | Admitting: Nurse Practitioner

## 2020-10-10 ENCOUNTER — Other Ambulatory Visit: Payer: Self-pay

## 2020-10-10 ENCOUNTER — Encounter: Payer: Self-pay | Admitting: Nurse Practitioner

## 2020-10-10 VITALS — BP 141/84 | HR 105 | Temp 98.0°F

## 2020-10-10 DIAGNOSIS — J1282 Pneumonia due to coronavirus disease 2019: Secondary | ICD-10-CM | POA: Diagnosis not present

## 2020-10-10 DIAGNOSIS — U071 COVID-19: Secondary | ICD-10-CM

## 2020-10-10 NOTE — Assessment & Plan Note (Signed)
48-hour follow-up COVID-pneumonia.  Patient is reporting better symptoms, denies shortness of breath and fever-slight Rales right lower lobe on expiration, heart rate 108 better than 2 days ago. Repeat CBC completed- results pending. 4 weeks follow-up repeat chest x-ray to rule out pneumonia.

## 2020-10-10 NOTE — Progress Notes (Signed)
Acute Office Visit  Subjective:    Patient ID: Jay Larsen, male    DOB: 1956-09-28, 65 y.o.   MRN: [id number]  Chief Complaint  Patient presents with  . Follow-up    Covid pneumonia    HPI Patient is in today for 48-hour follow-up COVID-pneumonia.  Patient is reporting no shortness of breath, fever, chills or cough.  Past Medical History:  Diagnosis Date  . Anxiety   . Glaucoma     Past Surgical History:  Procedure Laterality Date  . EYE SURGERY Left 02/04/2015  . SPINE SURGERY      Family History  Problem Relation Age of Onset  . Diabetes Mother   . Hypertension Mother   . Hypertension Father     Social History   Socioeconomic History  . Marital status: Married    Spouse name: Not on file  . Number of children: Not on file  . Years of education: Not on file  . Highest education level: Not on file  Occupational History  . Not on file  Tobacco Use  . Smoking status: Never Smoker  . Smokeless tobacco: Never Used  Vaping Use  . Vaping Use: Never used  Substance and Sexual Activity  . Alcohol use: Yes    Comment: occasionally  . Drug use: No  . Sexual activity: Not on file  Other Topics Concern  . Not on file  Social History Narrative  . Not on file   Social Determinants of Health   Financial Resource Strain: Not on file  Food Insecurity: Not on file  Transportation Needs: Not on file  Physical Activity: Not on file  Stress: Not on file  Social Connections: Not on file  Intimate Partner Violence: Not on file    Outpatient Medications Prior to Visit  Medication Sig Dispense Refill  . azithromycin (ZITHROMAX Z-PAK) 250 MG tablet As directed 6 tablet 0  . latanoprost (XALATAN) 0.005 % ophthalmic solution     . LORazepam (ATIVAN) 0.5 MG tablet Take 1 tablet (0.5 mg total) by mouth every 12 (twelve) hours as needed for anxiety. 30 tablet 1  . ondansetron (ZOFRAN ODT) 4 MG disintegrating tablet Take 1 tablet (4 mg total) by mouth every 8  (eight) hours as needed for nausea or vomiting. 20 tablet 0  . promethazine-dextromethorphan (PROMETHAZINE-DM) 6.25-15 MG/5ML syrup Take 2.5 mLs by mouth 4 (four) times daily as needed for cough. 118 mL 0   No facility-administered medications prior to visit.     Review of Systems  Constitutional: Negative.   HENT: Negative.   Respiratory: Negative for cough, choking, chest tightness and shortness of breath.        Crackles right lower lobe on expiration  Cardiovascular: Negative for chest pain, palpitations and leg swelling.  Musculoskeletal: Negative.   All other systems reviewed and are negative.      Objective:    Physical Exam Vitals reviewed.  HENT:     Head: Normocephalic.     Nose: Nose normal.  Eyes:     Conjunctiva/sclera: Conjunctivae normal.  Cardiovascular:     Rate and Rhythm: Regular rhythm. Tachycardia present.     Heart sounds: Normal heart sounds.  Pulmonary:     Breath sounds: Rales present.  Abdominal:     General: Bowel sounds are normal.  Musculoskeletal:        General: Normal range of motion.     Cervical back: Normal range of motion.  Skin:    General: Skin is  warm.  Neurological:     Mental Status: He is alert and oriented to person, place, and time.  Psychiatric:        Behavior: Behavior normal.     BP (!) 141/84   Pulse (!) 105   Temp 98 F (36.7 C)   SpO2 94%  Wt Readings from Last 3 Encounters:  10/08/20 198 lb (89.8 kg)  12/06/18 198 lb (89.8 kg)  06/15/18 197 lb (89.4 kg)    Health Maintenance Due  Topic Date Due  . COLON CANCER SCREENING ANNUAL FOBT  03/30/2015  . TETANUS/TDAP  11/02/2018  . COLONOSCOPY (Pts 45-43yrs Insurance coverage will need to be confirmed)  01/12/2019  . INFLUENZA VACCINE  Never done    There are no preventive care reminders to display for this patient.   Lab Results  Component Value Date   TSH 1.180 10/08/2020   Lab Results  Component Value Date   WBC 11.1 (H) 10/08/2020   HGB 14.3  10/08/2020   HCT 40.7 10/08/2020   MCV 89 10/08/2020   PLT 459 (H) 10/08/2020   Lab Results  Component Value Date   NA 140 10/08/2020   K 3.8 10/08/2020   CO2 28 10/08/2020   GLUCOSE 97 10/08/2020   BUN 9 10/08/2020   CREATININE 0.94 10/08/2020   BILITOT 0.4 10/08/2020   ALKPHOS 90 10/08/2020   AST 39 10/08/2020   ALT 63 (H) 10/08/2020   PROT 7.5 10/08/2020   ALBUMIN 3.4 (L) 10/08/2020   CALCIUM 8.8 10/08/2020   Lab Results  Component Value Date   CHOL 166 05/29/2017   Lab Results  Component Value Date   HDL 44 05/29/2017   Lab Results  Component Value Date   LDLCALC 111 (H) 05/29/2017   Lab Results  Component Value Date   TRIG 53 05/29/2017   Lab Results  Component Value Date   CHOLHDL 3.8 05/29/2017   No results found for: HGBA1C     Assessment & Plan:   Problem List Items Addressed This Visit      Respiratory   Pneumonia due to COVID-19 virus - Primary    48-hour follow-up COVID-pneumonia.  Patient is reporting better symptoms, denies shortness of breath and fever-slight Rales right lower lobe on expiration, heart rate 108 better than 2 days ago. Repeat CBC completed- results pending. 4 weeks follow-up repeat chest x-ray to rule out pneumonia.        Relevant Orders   CBC with Differential         Daryll Drown, NP

## 2020-10-11 LAB — CMP14+EGFR
ALT: 63 IU/L — ABNORMAL HIGH (ref 0–44)
AST: 39 IU/L (ref 0–40)
Albumin/Globulin Ratio: 0.8 — ABNORMAL LOW (ref 1.2–2.2)
Albumin: 3.4 g/dL — ABNORMAL LOW (ref 3.8–4.8)
Alkaline Phosphatase: 90 IU/L (ref 44–121)
BUN/Creatinine Ratio: 10 (ref 10–24)
BUN: 9 mg/dL (ref 8–27)
Bilirubin Total: 0.4 mg/dL (ref 0.0–1.2)
CO2: 28 mmol/L (ref 20–29)
Calcium: 8.8 mg/dL (ref 8.6–10.2)
Chloride: 97 mmol/L (ref 96–106)
Creatinine, Ser: 0.94 mg/dL (ref 0.76–1.27)
GFR calc Af Amer: 100 mL/min/{1.73_m2} (ref >59)
GFR calc non Af Amer: 87 mL/min/{1.73_m2} (ref >59)
Globulin, Total: 4.1 g/dL (ref 1.5–4.5)
Glucose: 97 mg/dL (ref 65–99)
Potassium: 3.8 mmol/L (ref 3.5–5.2)
Sodium: 140 mmol/L (ref 134–144)
Total Protein: 7.5 g/dL (ref 6.0–8.5)

## 2020-10-11 LAB — DRUG SCREEN 10 W/CONF, SERUM
Amphetamines, IA: NEGATIVE ng/mL
Barbiturates, IA: NEGATIVE ug/mL
Benzodiazepines, IA: NEGATIVE ng/mL
Cocaine & Metabolite, IA: NEGATIVE ng/mL
Methadone, IA: NEGATIVE ng/mL
Opiates, IA: NEGATIVE ng/mL
Oxycodones, IA: NEGATIVE ng/mL
Phencyclidine, IA: NEGATIVE ng/mL
Propoxyphene, IA: NEGATIVE ng/mL
THC(Marijuana) Metabolite, IA: NEGATIVE ng/mL

## 2020-10-11 LAB — CBC WITH DIFFERENTIAL/PLATELET
Basophils Absolute: 0.1 10*3/uL (ref 0.0–0.2)
Basophils Absolute: 0.1 10*3/uL (ref 0.0–0.2)
Basos: 1 %
Basos: 1 %
EOS (ABSOLUTE): 0.1 10*3/uL (ref 0.0–0.4)
EOS (ABSOLUTE): 0.1 10*3/uL (ref 0.0–0.4)
Eos: 1 %
Eos: 1 %
Hematocrit: 40.7 % (ref 37.5–51.0)
Hematocrit: 40.5 % (ref 37.5–51.0)
Hemoglobin: 14.3 g/dL (ref 13.0–17.7)
Hemoglobin: 14.2 g/dL (ref 13.0–17.7)
Immature Grans (Abs): 0.1 10*3/uL (ref 0.0–0.1)
Immature Grans (Abs): 0.1 10*3/uL (ref 0.0–0.1)
Immature Granulocytes: 1 %
Immature Granulocytes: 1 %
Lymphocytes Absolute: 1.7 10*3/uL (ref 0.7–3.1)
Lymphocytes Absolute: 1.6 10*3/uL (ref 0.7–3.1)
Lymphs: 15 %
Lymphs: 16 %
MCH: 31.3 pg (ref 26.6–33.0)
MCH: 30.9 pg (ref 26.6–33.0)
MCHC: 35.1 g/dL (ref 31.5–35.7)
MCHC: 35.1 g/dL (ref 31.5–35.7)
MCV: 89 fL (ref 79–97)
MCV: 88 fL (ref 79–97)
Monocytes Absolute: 1 10*3/uL — ABNORMAL HIGH (ref 0.1–0.9)
Monocytes Absolute: 0.9 10*3/uL (ref 0.1–0.9)
Monocytes: 9 %
Monocytes: 9 %
Neutrophils Absolute: 8.1 10*3/uL — ABNORMAL HIGH (ref 1.4–7.0)
Neutrophils Absolute: 7.3 10*3/uL — ABNORMAL HIGH (ref 1.4–7.0)
Neutrophils: 73 %
Neutrophils: 72 %
Platelets: 459 10*3/uL — ABNORMAL HIGH (ref 150–450)
Platelets: 452 10*3/uL — ABNORMAL HIGH (ref 150–450)
RBC: 4.57 x10E6/uL (ref 4.14–5.80)
RBC: 4.59 x10E6/uL (ref 4.14–5.80)
RDW: 11.4 % — ABNORMAL LOW (ref 11.6–15.4)
RDW: 11.5 % — ABNORMAL LOW (ref 11.6–15.4)
WBC: 11.1 10*3/uL — ABNORMAL HIGH (ref 3.4–10.8)
WBC: 10.1 10*3/uL (ref 3.4–10.8)

## 2020-10-11 LAB — TSH: TSH: 1.18 u[IU]/mL (ref 0.450–4.500)

## 2020-11-05 ENCOUNTER — Encounter: Payer: Self-pay | Admitting: Family

## 2020-11-05 ENCOUNTER — Ambulatory Visit: Payer: Self-pay | Admitting: Nurse Practitioner

## 2020-11-05 ENCOUNTER — Other Ambulatory Visit: Payer: Self-pay

## 2020-11-05 ENCOUNTER — Ambulatory Visit (INDEPENDENT_AMBULATORY_CARE_PROVIDER_SITE_OTHER): Payer: 59

## 2020-11-05 ENCOUNTER — Ambulatory Visit (INDEPENDENT_AMBULATORY_CARE_PROVIDER_SITE_OTHER): Payer: 59 | Admitting: Family

## 2020-11-05 VITALS — BP 141/77 | HR 103 | Temp 97.8°F | Ht 76.0 in | Wt 177.8 lb

## 2020-11-05 DIAGNOSIS — J1282 Pneumonia due to coronavirus disease 2019: Secondary | ICD-10-CM | POA: Diagnosis not present

## 2020-11-05 DIAGNOSIS — U071 COVID-19: Secondary | ICD-10-CM

## 2020-11-05 DIAGNOSIS — R748 Abnormal levels of other serum enzymes: Secondary | ICD-10-CM | POA: Diagnosis not present

## 2020-11-05 NOTE — Patient Instructions (Signed)

## 2020-11-05 NOTE — Progress Notes (Signed)
Subjective:    Patient ID: Jay Larsen, male    DOB: Oct 25, 1955, 65 y.o.   MRN: [id number]  Chief Complaint  Patient presents with  . Follow-up    HPI Pt presents to the office to repeat chest x-ray. He was diagnosed with COVID  1227/21 and developed into COVID pneumonia.   He reports he is feeling "good" now. He reports he still has intermittent fatigue and has got all his strength back yet. Denies any fever, SOB, swelling. He does report an intermittent dry cough.   His liver enzymes were slightly elevated on 10/09/19 with his ALT at 63. Denies any abdomen pain, swelling, or jaundice.     Review of Systems  All other systems reviewed and are negative.      Objective:   Physical Exam Vitals reviewed.  Constitutional:      General: He is not in acute distress.    Appearance: He is well-developed and well-nourished.  HENT:     Head: Normocephalic.     Right Ear: Tympanic membrane normal.     Left Ear: Tympanic membrane normal.     Mouth/Throat:     Mouth: Oropharynx is clear and moist.  Eyes:     General:        Right eye: No discharge.        Left eye: No discharge.     Pupils: Pupils are equal, round, and reactive to light.  Neck:     Thyroid: No thyromegaly.  Cardiovascular:     Rate and Rhythm: Normal rate and regular rhythm.     Pulses: Intact distal pulses.     Heart sounds: Normal heart sounds. No murmur heard.   Pulmonary:     Effort: Pulmonary effort is normal. No respiratory distress.     Breath sounds: Normal breath sounds. No wheezing.  Abdominal:     General: Bowel sounds are normal. There is no distension.     Palpations: Abdomen is soft.     Tenderness: There is no abdominal tenderness.  Musculoskeletal:        General: No tenderness or edema. Normal range of motion.     Cervical back: Normal range of motion and neck supple.  Skin:    General: Skin is warm and dry.     Findings: No erythema or rash.  Neurological:     Mental Status: He  is alert and oriented to person, place, and time.     Cranial Nerves: No cranial nerve deficit.     Deep Tendon Reflexes: Reflexes are normal and symmetric.  Psychiatric:        Mood and Affect: Mood and affect normal.        Behavior: Behavior normal.        Thought Content: Thought content normal.        Judgment: Judgment normal.    X-ray- Negative, Preliminary reading by Evelina Dun, FNP [facility]    BP (!) 141/77   Pulse (!) 103   Temp 97.8 F (36.6 C)   Ht '6\' 4"'  (1.93 m)   Wt 177 lb 12.8 oz (80.6 kg)   SpO2 96%   BMI 21.64 kg/m      Assessment & Plan:  Jay Larsen comes in today with chief complaint of Follow-up   Diagnosis and orders addressed:  1. Pneumonia due to COVID-19 virus Resolved  Labs pending  Fore fluids Encourage ROM exercises  - DG Chest 2 View; Future - CBC with Differential/Platelet -  BMP8+EGFR - Hepatic function panel  2. Elevated liver enzymes Labs pending   Keep follow up with PCP  Evelina Dun, FNP

## 2020-11-06 LAB — HEPATIC FUNCTION PANEL
ALT: 20 IU/L (ref 0–44)
AST: 19 IU/L (ref 0–40)
Albumin: 4.8 g/dL (ref 3.8–4.8)
Alkaline Phosphatase: 82 IU/L (ref 44–121)
Bilirubin Total: 0.5 mg/dL (ref 0.0–1.2)
Bilirubin, Direct: 0.13 mg/dL (ref 0.00–0.40)
Total Protein: 7.5 g/dL (ref 6.0–8.5)

## 2020-11-06 LAB — BMP8+EGFR
BUN/Creatinine Ratio: 10 (ref 10–24)
BUN: 11 mg/dL (ref 8–27)
CO2: 25 mmol/L (ref 20–29)
Calcium: 9.5 mg/dL (ref 8.6–10.2)
Chloride: 103 mmol/L (ref 96–106)
Creatinine, Ser: 1.1 mg/dL (ref 0.76–1.27)
GFR calc Af Amer: 83 mL/min/{1.73_m2} (ref >59)
GFR calc non Af Amer: 72 mL/min/{1.73_m2} (ref >59)
Glucose: 103 mg/dL — ABNORMAL HIGH (ref 65–99)
Potassium: 5.2 mmol/L (ref 3.5–5.2)
Sodium: 144 mmol/L (ref 134–144)

## 2020-11-06 LAB — CBC WITH DIFFERENTIAL/PLATELET
Basophils Absolute: 0.1 10*3/uL (ref 0.0–0.2)
Basos: 1 %
EOS (ABSOLUTE): 0.2 10*3/uL (ref 0.0–0.4)
Eos: 2 %
Hematocrit: 41.5 % (ref 37.5–51.0)
Hemoglobin: 14.3 g/dL (ref 13.0–17.7)
Immature Grans (Abs): 0 10*3/uL (ref 0.0–0.1)
Immature Granulocytes: 0 %
Lymphocytes Absolute: 1.9 10*3/uL (ref 0.7–3.1)
Lymphs: 27 %
MCH: 31.4 pg (ref 26.6–33.0)
MCHC: 34.5 g/dL (ref 31.5–35.7)
MCV: 91 fL (ref 79–97)
Monocytes Absolute: 0.6 10*3/uL (ref 0.1–0.9)
Monocytes: 9 %
Neutrophils Absolute: 4.5 10*3/uL (ref 1.4–7.0)
Neutrophils: 61 %
Platelets: 234 10*3/uL (ref 150–450)
RBC: 4.55 x10E6/uL (ref 4.14–5.80)
RDW: 12.6 % (ref 11.6–15.4)
WBC: 7.2 10*3/uL (ref 3.4–10.8)

## 2021-01-10 ENCOUNTER — Other Ambulatory Visit: Payer: Self-pay | Admitting: Family Medicine

## 2021-01-10 DIAGNOSIS — F411 Generalized anxiety disorder: Secondary | ICD-10-CM

## 2022-03-17 IMAGING — DX DG CHEST 2V
2 series · 2 of 2 positions shown · non-contrast
Comparison: 10/08/2020

CLINICAL DATA: 64-year-old male with history of COVID 19 associated
pneumonia.

EXAM:
CHEST - 2 VIEW

[chest pa]
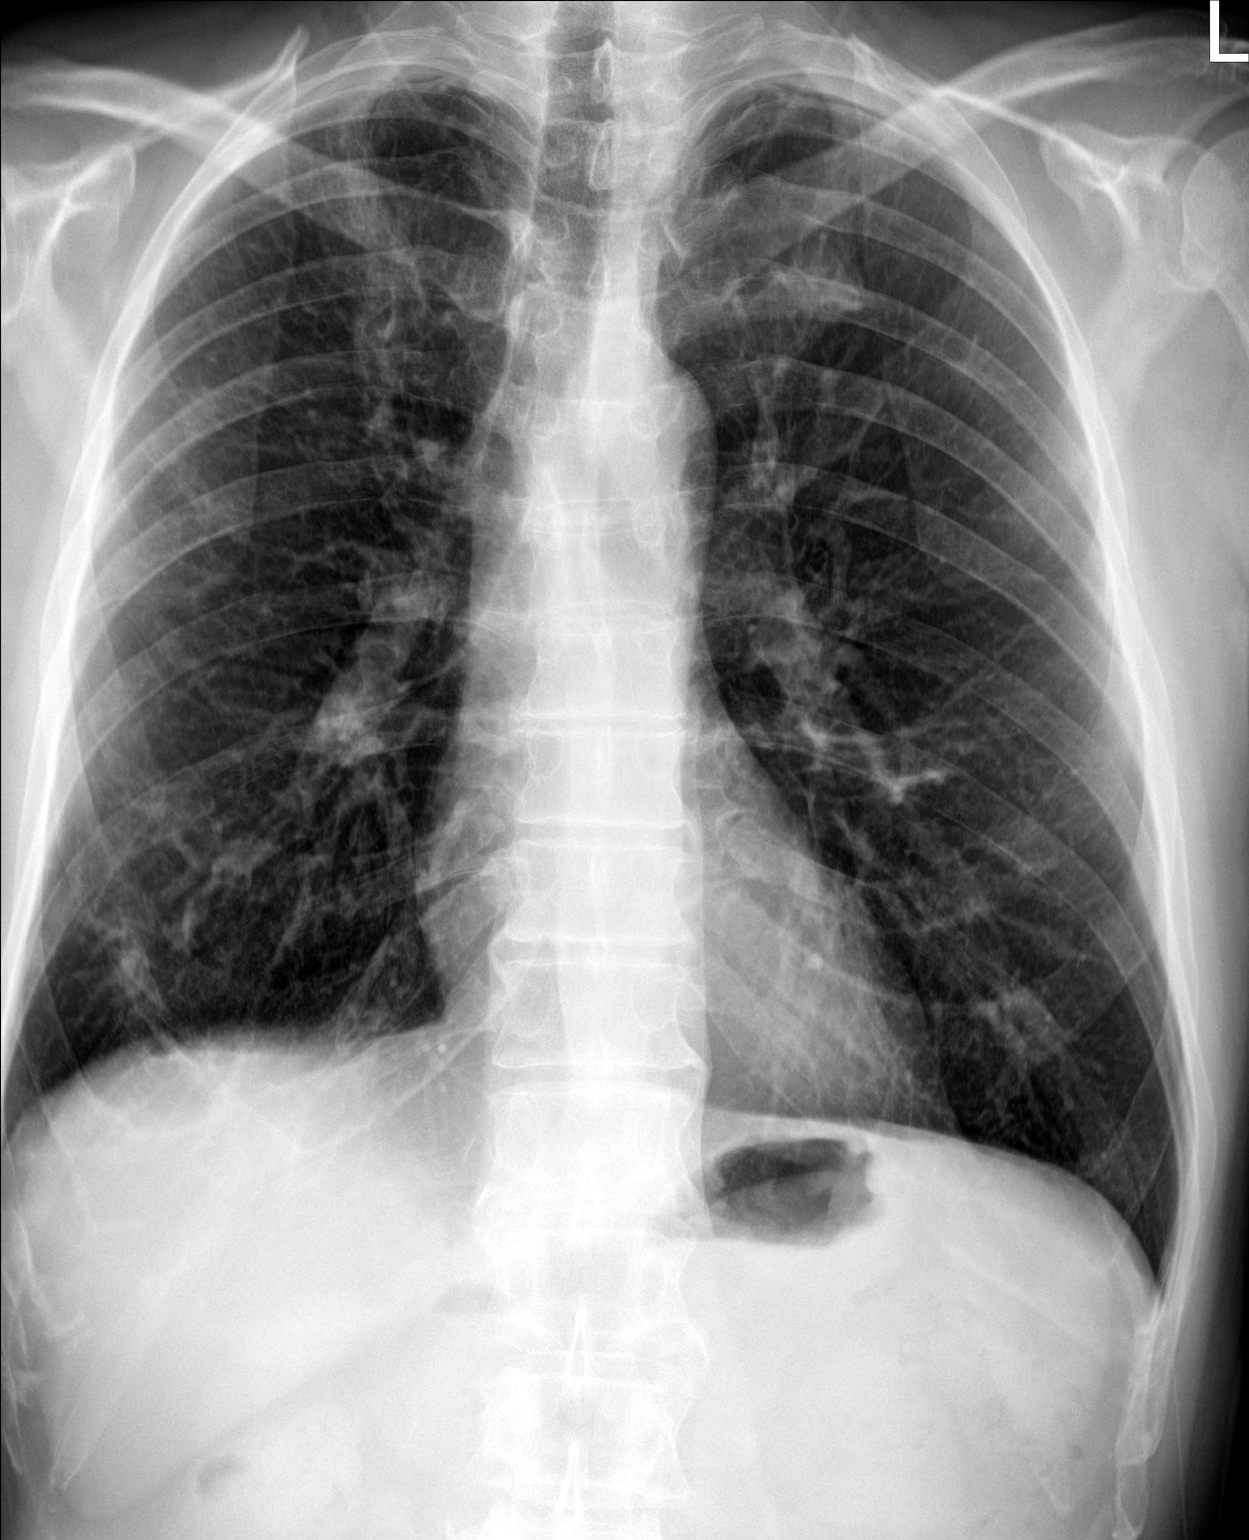

[chest lat]
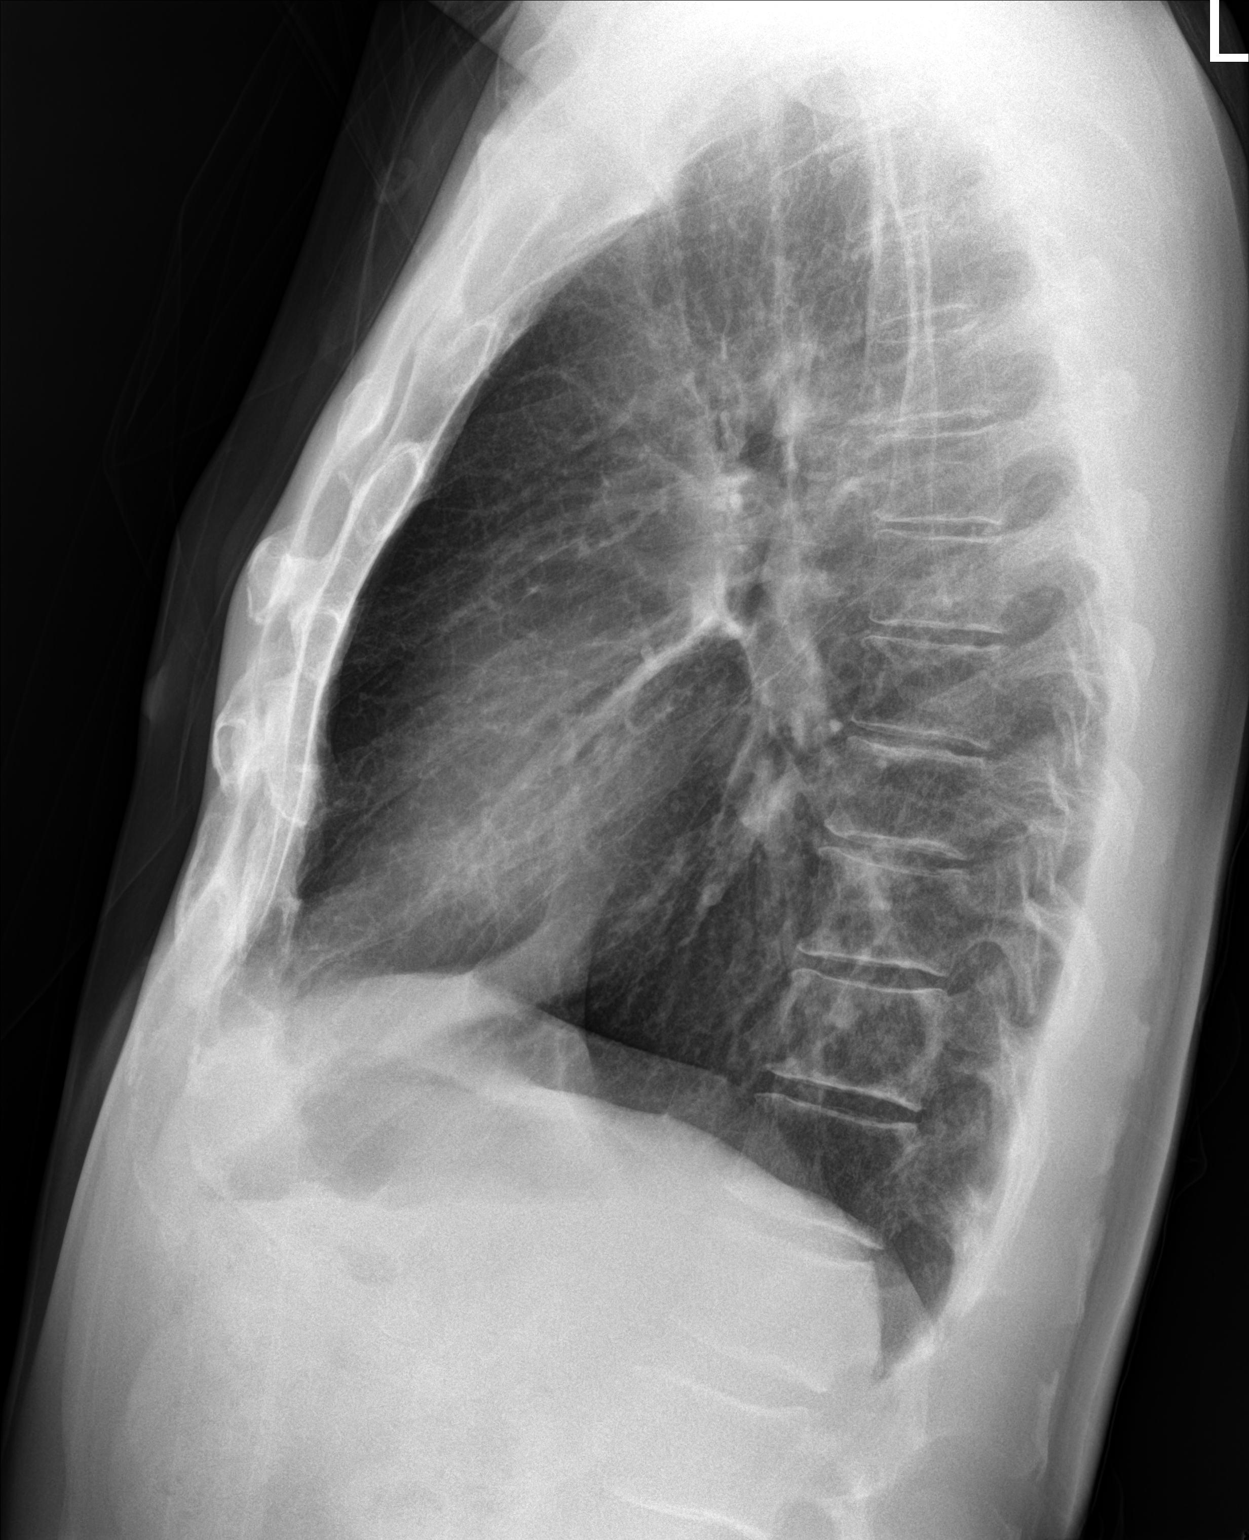

[2 of 2 positions shown; findings below may reference images not displayed]

FINDINGS: The heart size and mediastinal contours are within normal limits.
Significantly decreased conspicuity of the previously visualized
multifocal bilateral peripheral and lower lobe predominant
opacities. No new focal consolidations. No pleural effusion or
pneumothorax. The visualized skeletal structures are unremarkable.
IMPRESSION: Resolving multifocal pneumonia.  No complicating features.

## 2022-06-05 DIAGNOSIS — H40021 Open angle with borderline findings, high risk, right eye: Secondary | ICD-10-CM | POA: Diagnosis not present

## 2023-03-23 DIAGNOSIS — H40021 Open angle with borderline findings, high risk, right eye: Secondary | ICD-10-CM | POA: Diagnosis not present

## 2023-06-17 DIAGNOSIS — H40021 Open angle with borderline findings, high risk, right eye: Secondary | ICD-10-CM | POA: Diagnosis not present

## 2023-09-28 DIAGNOSIS — H4032X3 Glaucoma secondary to eye trauma, left eye, severe stage: Secondary | ICD-10-CM | POA: Diagnosis not present

## 2024-03-29 DIAGNOSIS — H524 Presbyopia: Secondary | ICD-10-CM | POA: Diagnosis not present

## 2024-03-29 DIAGNOSIS — H40051 Ocular hypertension, right eye: Secondary | ICD-10-CM | POA: Diagnosis not present

## 2024-04-13 ENCOUNTER — Ambulatory Visit: Payer: Self-pay | Admitting: Nurse Practitioner

## 2024-04-20 ENCOUNTER — Ambulatory Visit (INDEPENDENT_AMBULATORY_CARE_PROVIDER_SITE_OTHER): Payer: Self-pay | Admitting: Nurse Practitioner

## 2024-04-20 ENCOUNTER — Encounter: Payer: Self-pay | Admitting: Nurse Practitioner

## 2024-04-20 VITALS — BP 135/76 | HR 79 | Temp 97.7°F | Ht 76.0 in | Wt 186.6 lb

## 2024-04-20 DIAGNOSIS — Z1211 Encounter for screening for malignant neoplasm of colon: Secondary | ICD-10-CM | POA: Insufficient documentation

## 2024-04-20 DIAGNOSIS — Z0001 Encounter for general adult medical examination with abnormal findings: Secondary | ICD-10-CM | POA: Insufficient documentation

## 2024-04-20 DIAGNOSIS — F411 Generalized anxiety disorder: Secondary | ICD-10-CM | POA: Diagnosis not present

## 2024-04-20 DIAGNOSIS — R351 Nocturia: Secondary | ICD-10-CM | POA: Insufficient documentation

## 2024-04-20 LAB — LIPID PANEL

## 2024-04-20 NOTE — Progress Notes (Signed)
 New Patient Office Visit  Subjective   Patient ID: Jay Larsen, male    DOB: 1956/03/29  Age: 68 y.o. MRN: [id number]  CC:  Chief Complaint  Patient presents with   Establish Care    HPI Jay Larsen presents on 04/20/2024 to establish care. He reports a long-standing history of anxiety and states he has been prescribed lorazepam  since 1998. He notes, "I was never offered any other medication." He reports that lorazepam  has been the only medication that has effectively managed his symptoms. However, he has not taken lorazepam  in over one month. The patient is agreeable to a psychiatric referral for further evaluation of generalized anxiety disorder (GAD) and to discuss the potential continuation or adjustment of benzodiazepine therapy.     04/20/2024    1:42 PM 11/05/2020   11:49 AM 10/10/2020    4:17 PM  PHQ9 SCORE ONLY  PHQ-9 Total Score 0 0 0    Outpatient Encounter Medications as of 04/20/2024  Medication Sig   latanoprost (XALATAN) 0.005 % ophthalmic solution    LORazepam  (ATIVAN ) 0.5 MG tablet Take 1 tablet (0.5 mg total) by mouth every 12 (twelve) hours as needed for anxiety.   No facility-administered encounter medications on file as of 04/20/2024.    Past Medical History:  Diagnosis Date   Anxiety    Glaucoma     Past Surgical History:  Procedure Laterality Date   EYE SURGERY Left 02/04/2015   SPINE SURGERY      Family History  Problem Relation Age of Onset   Diabetes Mother    Hypertension Mother    Hypertension Father     Social History   Socioeconomic History   Marital status: Married    Spouse name: Not on file   Number of children: Not on file   Years of education: Not on file   Highest education level: Not on file  Occupational History   Not on file  Tobacco Use   Smoking status: Never   Smokeless tobacco: Never  Vaping Use   Vaping status: Never Used  Substance and Sexual Activity   Alcohol use: Yes    Comment: occasionally    Drug use: No   Sexual activity: Not on file  Other Topics Concern   Not on file  Social History Narrative   Not on file   Social Drivers of Health   Financial Resource Strain: Not on file  Food Insecurity: Not on file  Transportation Needs: Not on file  Physical Activity: Not on file  Stress: Not on file  Social Connections: Not on file  Intimate Partner Violence: Not on file    Review of Systems  Constitutional:  Negative for chills and fever.  HENT:  Negative for congestion, ear pain and sore throat.   Eyes:  Negative for pain and redness.  Respiratory:  Negative for cough, sputum production and wheezing.   Cardiovascular:  Negative for chest pain and leg swelling.  Gastrointestinal:  Negative for blood in stool, constipation, nausea and vomiting.  Musculoskeletal:  Negative for falls.  Skin:  Negative for itching and rash.   Negative unless indicated in HPI    Objective   BP 135/76   Pulse 79   Temp 97.7 F (36.5 C) (Temporal)   Ht 6' 4 (1.93 m)   Wt 186 lb 9.6 oz (84.6 kg)   SpO2 99%   BMI 22.71 kg/m   Physical Exam Vitals and nursing note reviewed.  Constitutional:  General: He is not in acute distress. HENT:     Head: Normocephalic and atraumatic.     Right Ear: Tympanic membrane, ear canal and external ear normal. There is no impacted cerumen.     Left Ear: Tympanic membrane, ear canal and external ear normal. There is no impacted cerumen.     Nose: Nose normal.     Mouth/Throat:     Mouth: Mucous membranes are moist.  Eyes:     General: No scleral icterus.    Extraocular Movements: Extraocular movements intact.     Conjunctiva/sclera: Conjunctivae normal.     Pupils: Pupils are equal, round, and reactive to light.  Neck:     Vascular: No carotid bruit.  Pulmonary:     Effort: Pulmonary effort is normal.     Breath sounds: Normal breath sounds. No wheezing.  Abdominal:     General: Bowel sounds are normal.     Palpations: Abdomen is soft.   Musculoskeletal:        General: Normal range of motion.     Cervical back: Normal range of motion and neck supple. No rigidity or tenderness.     Right lower leg: No edema.     Left lower leg: No edema.  Lymphadenopathy:     Cervical: No cervical adenopathy.  Skin:    General: Skin is warm and dry.     Findings: No rash.  Neurological:     Mental Status: He is alert and oriented to person, place, and time. Mental status is at baseline.  Psychiatric:        Attention and Perception: Attention and perception normal.        Mood and Affect: Mood and affect normal.        Speech: Speech normal.        Behavior: Behavior normal. Behavior is cooperative.        Thought Content: Thought content normal. Thought content does not include suicidal ideation. Thought content does not include suicidal plan.        Cognition and Memory: Cognition and memory normal.        Judgment: Judgment normal.     Last CBC Lab Results  Component Value Date   WBC 7.2 11/05/2020   HGB 14.3 11/05/2020   HCT 41.5 11/05/2020   MCV 91 11/05/2020   MCH 31.4 11/05/2020   RDW 12.6 11/05/2020   PLT 234 11/05/2020   Last metabolic panel Lab Results  Component Value Date   GLUCOSE 103 (H) 11/05/2020   NA 144 11/05/2020   K 5.2 11/05/2020   CL 103 11/05/2020   CO2 25 11/05/2020   BUN 11 11/05/2020   CREATININE 1.10 11/05/2020   GFRNONAA 72 11/05/2020   CALCIUM 9.5 11/05/2020   PROT 7.5 11/05/2020   ALBUMIN 4.8 11/05/2020   LABGLOB 4.1 10/08/2020   AGRATIO 0.8 (L) 10/08/2020   BILITOT 0.5 11/05/2020   ALKPHOS 82 11/05/2020   AST 19 11/05/2020   ALT 20 11/05/2020   Last lipids Lab Results  Component Value Date   CHOL 166 05/29/2017   HDL 44 05/29/2017   LDLCALC 111 (H) 05/29/2017   TRIG 53 05/29/2017   CHOLHDL 3.8 05/29/2017    Last thyroid  functions Lab Results  Component Value Date   TSH 1.180 10/08/2020        Assessment & Plan:  Encounter for general adult medical examination  with abnormal findings -     CBC with Differential/Platelet -  Comprehensive metabolic panel with GFR -     Lipid panel -     Thyroid  Panel With TSH  Screening for colon cancer -     Cologuard  Nocturia -     PSA, total and free  GAD (generalized anxiety disorder) -     Ambulatory referral to Psychiatry   She is a 68 year old Caucasian male seen today to establish care, no acute distress GAD: Please refer to psychiatry for evaluation and possible continuation of benzodiazepine that he has been on since 1998 Labs: CBC, CMP, lipid, TSH, PSA result pending Of maintenance: Cologuard ordered result pending Encourage healthy lifestyle choices, including diet (rich in fruits, vegetables, and lean proteins, and low in salt and simple carbohydrates) and exercise (at least 30 minutes of moderate physical activity daily).     The above assessment and management plan was discussed with the patient. The patient verbalized understanding of and has agreed to the management plan. Patient is aware to call the clinic if they develop any new symptoms or if symptoms persist or worsen. Patient is aware when to return to the clinic for a follow-up visit. Patient educated on when it is appropriate to go to the emergency department.  Return in about 6 months (around 10/21/2024) for follow-up.   Tanice Petre [place] Thompson, DNP [facility] [address] [place], [place] [address] [phone]  Note: This document was prepared by [employer] voice dictation technology and any errors that results from this process are unintentional.

## 2024-04-21 ENCOUNTER — Ambulatory Visit: Payer: Self-pay | Admitting: Nurse Practitioner

## 2024-04-21 LAB — COMPREHENSIVE METABOLIC PANEL WITH GFR
ALT: 16 IU/L (ref 0–44)
AST: 19 IU/L (ref 0–40)
Albumin: 4.7 g/dL (ref 3.9–4.9)
Alkaline Phosphatase: 73 IU/L (ref 44–121)
BUN/Creatinine Ratio: 10 (ref 10–24)
BUN: 10 mg/dL (ref 8–27)
Bilirubin Total: 0.8 mg/dL (ref 0.0–1.2)
CO2: 22 mmol/L (ref 20–29)
Calcium: 9.2 mg/dL (ref 8.6–10.2)
Chloride: 99 mmol/L (ref 96–106)
Creatinine, Ser: 0.99 mg/dL (ref 0.76–1.27)
Globulin, Total: 2.5 g/dL (ref 1.5–4.5)
Glucose: 91 mg/dL (ref 70–99)
Potassium: 4.1 mmol/L (ref 3.5–5.2)
Sodium: 137 mmol/L (ref 134–144)
Total Protein: 7.2 g/dL (ref 6.0–8.5)
eGFR: 84 mL/min/1.73 (ref >59)

## 2024-04-21 LAB — THYROID PANEL WITH TSH
Free Thyroxine Index: 1.3 (ref 1.2–4.9)
T3 Uptake Ratio: 23 — AB (ref 24–39)
T4, Total: 5.8 ug/dL (ref 4.5–12.0)
TSH: 2.1 u[IU]/mL (ref 0.450–4.500)

## 2024-04-21 LAB — CBC WITH DIFFERENTIAL/PLATELET
Basophils Absolute: 0.1 x10E3/uL (ref 0.0–0.2)
Basos: 1 %
EOS (ABSOLUTE): 0.2 x10E3/uL (ref 0.0–0.4)
Eos: 3 %
Hematocrit: 43 % (ref 37.5–51.0)
Hemoglobin: 14.6 g/dL (ref 13.0–17.7)
Immature Grans (Abs): 0 x10E3/uL (ref 0.0–0.1)
Immature Granulocytes: 0 %
Lymphocytes Absolute: 1.6 x10E3/uL (ref 0.7–3.1)
Lymphs: 32 %
MCH: 31.4 pg (ref 26.6–33.0)
MCHC: 34 g/dL (ref 31.5–35.7)
MCV: 93 fL (ref 79–97)
Monocytes Absolute: 0.6 x10E3/uL (ref 0.1–0.9)
Monocytes: 11 %
Neutrophils Absolute: 2.6 x10E3/uL (ref 1.4–7.0)
Neutrophils: 53 %
Platelets: 252 x10E3/uL (ref 150–450)
RBC: 4.65 x10E6/uL (ref 4.14–5.80)
RDW: 12.2 % (ref 11.6–15.4)
WBC: 5 x10E3/uL (ref 3.4–10.8)

## 2024-04-21 LAB — LIPID PANEL
Cholesterol, Total: 184 mg/dL (ref 100–199)
HDL: 42 mg/dL (ref >39)
LDL CALC COMMENT:: 4.4 ratio (ref 0.0–5.0)
LDL Chol Calc (NIH): 129 mg/dL — AB (ref 0–99)
Triglycerides: 68 mg/dL (ref 0–149)
VLDL Cholesterol Cal: 13 mg/dL (ref 5–40)

## 2024-04-21 LAB — PSA, TOTAL AND FREE
PSA, Free Pct: 56.7
PSA, Free: 0.17 ng/mL
Prostate Specific Ag, Serum: 0.3 ng/mL (ref 0.0–4.0)

## 2024-04-27 DIAGNOSIS — Z1211 Encounter for screening for malignant neoplasm of colon: Secondary | ICD-10-CM | POA: Diagnosis not present

## 2024-05-03 LAB — COLOGUARD: COLOGUARD: NEGATIVE

## 2024-09-05 ENCOUNTER — Ambulatory Visit: Payer: Self-pay

## 2024-09-05 VITALS — BP 135/76 | HR 79 | Ht 76.0 in | Wt 186.0 lb

## 2024-09-05 DIAGNOSIS — Z0000 Encounter for general adult medical examination without abnormal findings: Secondary | ICD-10-CM

## 2024-09-05 NOTE — Progress Notes (Signed)
 Chief Complaint  Patient presents with   Medicare Wellness     Subjective:   Jay Larsen is a 68 y.o. male who presents for a Medicare Annual Wellness Visit.  Visit info / Clinical Intake: Medicare Wellness Visit Type:: Subsequent Annual Wellness Visit Persons participating in visit and providing information:: patient Medicare Wellness Visit Mode:: Telephone If telephone:: video declined Since this visit was completed virtually, some vitals may be partially provided or unavailable. Missing vitals are due to the limitations of the virtual format.: Documented vitals are patient reported If Telephone or Video please confirm:: I connected with patient using audio/video enable telemedicine. I verified patient identity with two identifiers, discussed telehealth limitations, and patient agreed to proceed. Patient Location:: home Provider Location:: home office Interpreter Needed?: No Pre-visit prep was completed: yes AWV questionnaire completed by patient prior to visit?: no Living arrangements:: lives with spouse/significant other Patient's Overall Health Status Rating: very good Typical amount of pain: none Does pain affect daily life?: no Are you currently prescribed opioids?: no  Dietary Habits and Nutritional Risks How many meals a day?: 2 Eats fruit and vegetables daily?: yes Most meals are obtained by: preparing own meals In the last 2 weeks, have you had any of the following?: none Diabetic:: no  Functional Status Activities of Daily Living (to include ambulation/medication): Independent Ambulation: Independent Medication Administration: Independent Home Management (perform basic housework or laundry): Independent Manage your own finances?: yes Primary transportation is: driving Concerns about vision?: no *vision screening is required for WTM* Concerns about hearing?: no  Fall Screening Falls in the past year?: 0 Number of falls in past year: 0 Was there an  injury with Fall?: 0 Fall Risk Category Calculator: 0 Patient Fall Risk Level: Low Fall Risk  Fall Risk Patient at Risk for Falls Due to: No Fall Risks Fall risk Follow up: Falls evaluation completed; Education provided  Home and Transportation Safety: All rugs have non-skid backing?: yes All stairs or steps have railings?: (!) no Grab bars in the bathtub or shower?: (!) no Have non-skid surface in bathtub or shower?: yes Good home lighting?: yes Regular seat belt use?: yes Hospital stays in the last year:: no  Cognitive Assessment Difficulty concentrating, remembering, or making decisions? : no Will 6CIT or Mini Cog be Completed: yes What year is it?: 0 points What month is it?: 0 points Give patient an address phrase to remember (5 components): [address] [place], [place] About what time is it?: 0 points Count backwards from 20 to 1: 0 points Say the months of the year in reverse: 4 points Repeat the address phrase from earlier: 0 points 6 CIT Score: 4 points  Advance Directives (For Healthcare) Does Patient Have a Medical Advance Directive?: No Would patient like information on creating a medical advance directive?: No - Patient declined  Reviewed/Updated  Reviewed/Updated: Reviewed All (Medical, Surgical, Family, Medications, Allergies, Care Teams, Patient Goals); Medical History; Surgical History; Family History; Medications; Allergies; Care Teams; Patient Goals    Allergies (verified) Patient has no known allergies.   Current Medications (verified) Outpatient Encounter Medications as of 09/05/2024  Medication Sig   latanoprost (XALATAN) 0.005 % ophthalmic solution    LORazepam  (ATIVAN ) 0.5 MG tablet Take 1 tablet (0.5 mg total) by mouth every 12 (twelve) hours as needed for anxiety. (Patient not taking: Reported on 09/05/2024)   No facility-administered encounter medications on file as of 09/05/2024.    History: Past Medical History:  Diagnosis Date   Anxiety  Glaucoma    Past Surgical History:  Procedure Laterality Date   EYE SURGERY Left 02/04/2015   SPINE SURGERY     Family History  Problem Relation Age of Onset   Diabetes Mother    Hypertension Mother    Hypertension Father    Social History   Occupational History   Not on file  Tobacco Use   Smoking status: Never   Smokeless tobacco: Never  Vaping Use   Vaping status: Never Used  Substance and Sexual Activity   Alcohol use: Yes    Comment: occasionally   Drug use: No   Sexual activity: Not on file   Tobacco Counseling Counseling given: Not Answered  SDOH Screenings   Food Insecurity: No Food Insecurity (09/05/2024)  Housing: Unknown (09/05/2024)  Transportation Needs: No Transportation Needs (09/05/2024)  Utilities: Not At Risk (09/05/2024)  Depression (PHQ2-9): Low Risk  (09/05/2024)  Physical Activity: Sufficiently Active (09/05/2024)  Social Connections: Moderately Integrated (09/05/2024)  Stress: No Stress Concern Present (09/05/2024)  Tobacco Use: Low Risk  (09/05/2024)  Health Literacy: Adequate Health Literacy (09/05/2024)   See flowsheets for full screening details  Depression Screen PHQ 2 & 9 Depression Scale- Over the past 2 weeks, how often have you been bothered by any of the following problems? Little interest or pleasure in doing things: 0 Feeling down, depressed, or hopeless (PHQ Adolescent also includes...irritable): 0 PHQ-2 Total Score: 0 Trouble falling or staying asleep, or sleeping too much: 0 Feeling tired or having little energy: 0 Poor appetite or overeating (PHQ Adolescent also includes...weight loss): 0 Feeling bad about yourself - or that you are a failure or have let yourself or your family down: 0 Trouble concentrating on things, such as reading the newspaper or watching television (PHQ Adolescent also includes...like school work): 0 Moving or speaking so slowly that other people could have noticed. Or the opposite - being so fidgety or  restless that you have been moving around a lot more than usual: 0 Thoughts that you would be better off dead, or of hurting yourself in some way: 0 PHQ-9 Total Score: 0 If you checked off any problems, how difficult have these problems made it for you to do your work, take care of things at home, or get along with other people?: Not difficult at all     Goals Addressed   None          Objective:    There were no vitals filed for this visit. There is no height or weight on file to calculate BMI.  Hearing/Vision screen Hearing Screening - Comments:: Pt have hearing dif, loss of hearing in the L-ear Vision Screening - Comments:: Pt's vision is update [facility] [place]/Dr. Vernell Moats Immunizations and Health Maintenance Health Maintenance  Topic Date Due   Zoster Vaccines- Shingrix (1 of 2) Never done   Influenza Vaccine  Never done   COVID-19 Vaccine (1 - 2025-26 season) Never done   DTaP/Tdap/Td (2 - Tdap) 04/20/2025 (Originally 11/02/2018)   Pneumococcal Vaccine: 50+ Years (1 of 1 - PCV) 04/20/2025 (Originally 03/06/2008)   Medicare Annual Wellness (AWV)  09/05/2025   Fecal DNA (Cologuard)  04/28/2027   Hepatitis C Screening  Completed   Meningococcal B Vaccine  Aged Out   Colonoscopy  Discontinued        Assessment/Plan:  This is a routine wellness examination for Jay Larsen.  Patient Care Team: Deitra Morton Hummer, Nena, NP as PCP - General (Nurse Practitioner)  I have personally  reviewed and noted the following in the patient's chart:   Medical and social history Use of alcohol, tobacco or illicit drugs  Current medications and supplements including opioid prescriptions. Functional ability and status Nutritional status Physical activity Advanced directives List of other physicians Hospitalizations, surgeries, and ER visits in previous 12 months Vitals Screenings to include cognitive, depression, and falls Referrals and appointments  No orders of the  defined types were placed in this encounter.  In addition, I have reviewed and discussed with patient certain preventive protocols, quality metrics, and best practice recommendations. A written personalized care plan for preventive services as well as general preventive health recommendations were provided to patient.   Jay Larsen, CMA   09/05/2024   Return in 1 year (on 09/05/2025).  After Visit Summary: (MyChart) Due to this being a telephonic visit, the after visit summary with patients personalized plan was offered to patient via MyChart   Nurse Notes: Pt is aware and due the following: Shingles, flu, covid--per pt will not get them

## 2024-09-05 NOTE — Patient Instructions (Signed)
 Mr. Skoczylas,  Thank you for taking the time for your Medicare Wellness Visit. I appreciate your continued commitment to your health goals. Please review the care plan we discussed, and feel free to reach out if I can assist you further.  Please note that Annual Wellness Visits do not include a physical exam. Some assessments may be limited, especially if the visit was conducted virtually. If needed, we may recommend an in-person follow-up with your provider.  Ongoing Care Seeing your primary care provider every 3 to 6 months helps us  monitor your health and provide consistent, personalized care.   Referrals If a referral was made during today's visit and you haven't received any updates within two weeks, please contact the referred provider directly to check on the status.  Recommended Screenings:  Health Maintenance  Topic Date Due   Zoster (Shingles) Vaccine (1 of 2) Never done   Flu Shot  Never done   COVID-19 Vaccine (1 - 2025-26 season) Never done   DTaP/Tdap/Td vaccine (2 - Tdap) 04/20/2025*   Pneumococcal Vaccine for age over 80 (1 of 1 - PCV) 04/20/2025*   Medicare Annual Wellness Visit  09/05/2025   Cologuard (Stool DNA test)  04/28/2027   Hepatitis C Screening  Completed   Meningitis B Vaccine  Aged Out   Colon Cancer Screening  Discontinued  *Topic was postponed. The date shown is not the original due date.       09/05/2024    8:46 AM  Advanced Directives  Does Patient Have a Medical Advance Directive? No  Would patient like information on creating a medical advance directive? No - Patient declined    Vision: Annual vision screenings are recommended for early detection of glaucoma, cataracts, and diabetic retinopathy. These exams can also reveal signs of chronic conditions such as diabetes and high blood pressure.  Dental: Annual dental screenings help detect early signs of oral cancer, gum disease, and other conditions linked to overall health, including heart disease  and diabetes.  Please see the attached documents for additional preventive care recommendations.

## 2024-10-24 ENCOUNTER — Ambulatory Visit: Payer: Self-pay | Admitting: Nurse Practitioner

## 2024-12-05 ENCOUNTER — Ambulatory Visit (HOSPITAL_COMMUNITY): Admitting: Registered Nurse

## 2025-09-06 ENCOUNTER — Ambulatory Visit
